# Patient Record
Sex: Female | Born: 1950
Health system: Southern US, Community
[De-identification: ages and names within clinical notes are randomized; demographics above are authoritative.]

## PROBLEM LIST (undated history)

## (undated) DIAGNOSIS — F419 Anxiety disorder, unspecified: Secondary | ICD-10-CM

## (undated) DIAGNOSIS — K219 Gastro-esophageal reflux disease without esophagitis: Secondary | ICD-10-CM

## (undated) DIAGNOSIS — J449 Chronic obstructive pulmonary disease, unspecified: Secondary | ICD-10-CM

## (undated) DIAGNOSIS — F32A Depression, unspecified: Secondary | ICD-10-CM

## (undated) DIAGNOSIS — M1990 Unspecified osteoarthritis, unspecified site: Secondary | ICD-10-CM

## (undated) DIAGNOSIS — I10 Essential (primary) hypertension: Secondary | ICD-10-CM

## (undated) DIAGNOSIS — F329 Major depressive disorder, single episode, unspecified: Secondary | ICD-10-CM

## (undated) DIAGNOSIS — K118 Other diseases of salivary glands: Secondary | ICD-10-CM

## (undated) HISTORY — DX: Chronic obstructive pulmonary disease, unspecified: J44.9

## (undated) HISTORY — DX: Essential (primary) hypertension: I10

## (undated) HISTORY — PX: ABDOMINAL HYSTERECTOMY: SHX81

## (undated) HISTORY — PX: CERVICAL SPINE SURGERY: SHX589

## (undated) HISTORY — PX: HERNIA REPAIR: SHX51

## (undated) HISTORY — PX: BREAST SURGERY: SHX581

---
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
## 2003-04-16 ENCOUNTER — Ambulatory Visit (HOSPITAL_COMMUNITY): Admission: RE | Admit: 2003-04-16 | Discharge: 2003-04-17 | Payer: Self-pay | Admitting: Orthopaedic Surgery

## 2016-12-03 ENCOUNTER — Institutional Professional Consult (permissible substitution): Payer: Self-pay | Admitting: Emergency Medicine

## 2016-12-28 ENCOUNTER — Institutional Professional Consult (permissible substitution): Payer: Self-pay | Admitting: Pulmonary Disease

## 2017-01-02 ENCOUNTER — Encounter: Payer: Self-pay | Admitting: Emergency Medicine

## 2017-01-03 ENCOUNTER — Other Ambulatory Visit (INDEPENDENT_AMBULATORY_CARE_PROVIDER_SITE_OTHER): Payer: Medicare HMO

## 2017-01-03 ENCOUNTER — Encounter: Payer: Self-pay | Admitting: *Deleted

## 2017-01-03 ENCOUNTER — Ambulatory Visit (INDEPENDENT_AMBULATORY_CARE_PROVIDER_SITE_OTHER): Payer: Medicare HMO | Admitting: Emergency Medicine

## 2017-01-03 VITALS — BP 138/86 | HR 85 | Ht 65.0 in | Wt 176.0 lb

## 2017-01-03 DIAGNOSIS — R05 Cough: Secondary | ICD-10-CM

## 2017-01-03 DIAGNOSIS — R053 Chronic cough: Secondary | ICD-10-CM

## 2017-01-03 DIAGNOSIS — B909 Sequelae of respiratory and unspecified tuberculosis: Secondary | ICD-10-CM

## 2017-01-03 DIAGNOSIS — R7611 Nonspecific reaction to tuberculin skin test without active tuberculosis: Secondary | ICD-10-CM | POA: Diagnosis not present

## 2017-01-03 DIAGNOSIS — J449 Chronic obstructive pulmonary disease, unspecified: Secondary | ICD-10-CM | POA: Insufficient documentation

## 2017-01-03 DIAGNOSIS — Z227 Latent tuberculosis: Secondary | ICD-10-CM | POA: Insufficient documentation

## 2017-01-03 LAB — BASIC METABOLIC PANEL
BUN: 19 mg/dL (ref 6–23)
CHLORIDE: 100 meq/L (ref 96–112)
CO2: 31 mEq/L (ref 19–32)
Calcium: 9.4 mg/dL (ref 8.4–10.5)
Creatinine, Ser: 1.11 mg/dL (ref 0.40–1.20)
GFR: 63.11 mL/min (ref >60.00)
Glucose, Bld: 109 mg/dL — ABNORMAL HIGH (ref 70–99)
POTASSIUM: 4 meq/L (ref 3.5–5.1)
Sodium: 138 mEq/L (ref 135–145)

## 2017-01-03 MED ORDER — FLUTICASONE PROPIONATE 50 MCG/ACT NA SUSP: 2.0000 | Freq: Every day | NASAL | 5 refills | Status: DC

## 2017-01-03 MED ORDER — LORATADINE 10 MG PO TABS: 10.0000 mg | ORAL_TABLET | Freq: Every day | ORAL | 5 refills | Status: DC

## 2017-01-03 MED ORDER — PANTOPRAZOLE SODIUM 40 MG PO TBEC: 40.0000 mg | DELAYED_RELEASE_TABLET | Freq: Every day | ORAL | 5 refills | Status: DC

## 2017-01-03 NOTE — Assessment & Plan Note (Signed)
Treated with single drug therapy many years ago. No evidence currently x-ray to support recurrence or active disease. She does have chronic cough and has had some chills, sweats, flulike symptoms. I believe it would be reasonable to repeat a CT scan of the chest to compare with 2012. Insure that there are no new involving nodules or infiltrates.

## 2017-01-03 NOTE — Assessment & Plan Note (Signed)
Likely multifactorial. She does have a history of suspected obstructive lung disease, asthma plus COPD. Also with poorly controlled reflux, poorly controlled allergic rhinitis. Her ACE- Inhibitor was recently changed to losartan. Finally given her history of latent TB we are compelled to ensure no evidence for active disease. Add pantoprazole, stop omeprazole, add loratadine and fluticasone nasal spray.

## 2017-01-03 NOTE — Progress Notes (Signed)
Subjective:    Patient ID: Cynthia Brewer, female    DOB: [birth year], 66 y.o.   MRN: [id number]  HPI 66 year old former smoker (35 pack years), with a history of hypertension, GERD, depression, treated for latent tuberculosis approximately 10 - 15 years ago ([place]). She carries a diagnosis of asthma /  COPD that was made 3-4 years ago. She had PFT at [facility] but unable to complete due to cough. She's been evaluated in September at [facility] for cough. At that time her lisinopril was changed to losartan, she was treated with prednisone and azithromycin. Has been treated with albuterol prn, was started Symbicort about 1 week ago. She has longstanding cough. She has allergy sx - congestion and drainage. Still has breakthrough reflux even on omeprazole. She coughs day and night, sometimes has to get up and has emesis in the night. CXR done recently  - no clear infiltrates. CT chest was reviewed by me from 01/16/2011, shows some very mild bibasilar atelectatic change, evidence for some emphysema, no nodules or discrete infiltrates. She states that she may occasionally have sweats. No fevers. Sometimes some flulike symptoms.   Review of Systems  Constitutional: Negative for fever and unexpected weight change.  HENT: Negative for congestion, dental problem, ear pain, nosebleeds, postnasal drip, rhinorrhea, sinus pressure, sneezing, sore throat and trouble swallowing.   Eyes: Negative for redness and itching.  Respiratory: Positive for shortness of breath. Negative for cough, chest tightness and wheezing.   Cardiovascular: Negative for palpitations and leg swelling.  Gastrointestinal: Negative for nausea and vomiting.  Genitourinary: Negative for dysuria.  Musculoskeletal: Negative for joint swelling.  Skin: Negative for rash.  Neurological: Positive for headaches.  Hematological: Does not bruise/bleed easily.  Psychiatric/Behavioral: Positive for dysphoric  mood. The patient is not nervous/anxious.     Past Medical History:  Diagnosis Date  . COPD (chronic obstructive pulmonary disease) ([place])   . Hypertension      No family history on file.   Social History   Social History  . Marital status: Married    Spouse name: N/A  . Number of children: N/A  . Years of education: N/A   Occupational History  . Not on file.   Social History Main Topics  . Smoking status: Former Smoker    Packs/day: 1.00    Years: 15.00    Types: Cigarettes    Quit date: 03/19/2006  . Smokeless tobacco: Never Used  . Alcohol use No  . Drug use: No  . Sexual activity: Not on file   Other Topics Concern  . Not on file   Social History Narrative  . 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   Allergies  Allergen Reactions  . Penicillins Hives     No outpatient prescriptions prior to visit.   No facility-administered medications prior to visit.         Objective:   Physical Exam Vitals:   01/03/17 1015  BP: 138/86  Pulse: 85  SpO2: 98%  Weight: 176 lb (79.8 kg)  Height: 5\' 5"  (1.651 m)   Gen: Pleasant, well-nourished, in no distress,  normal affect  ENT: No lesions,  mouth clear,  oropharynx clear, no postnasal drip  Neck: No JVD, no TMG, no carotid bruits  Lungs: No use of accessory muscles, clear without rales or rhonchi  Cardiovascular: RRR, heart sounds normal, no murmur or gallops, no  peripheral edema  Musculoskeletal: No deformities, no cyanosis or clubbing  Neuro: alert, non focal  Skin: Warm, no lesions or rashes      Assessment & Plan:  Latent tuberculosis by skin test Treated with single drug therapy many years ago. No evidence currently x-ray to support recurrence or active disease. She does have chronic cough and has had some chills, sweats, flulike symptoms. I believe it would be reasonable to repeat a CT scan of the chest to compare with 2012.  Insure that there are no new involving nodules or infiltrates.  Chronic cough Likely multifactorial. She does have a history of suspected obstructive lung disease, asthma plus COPD. Also with poorly controlled reflux, poorly controlled allergic rhinitis. Her ACE- Inhibitor was recently changed to losartan. Finally given her history of latent TB we are compelled to ensure no evidence for active disease. Add pantoprazole, stop omeprazole, add loratadine and fluticasone nasal spray.  COPD (chronic obstructive pulmonary disease) ([place]) Will continue Symbicort for now. Check all pulmonary function testing. Hopefully she will be able to perform once we get her cough under better control.  Baltazar Apo, MD, PhD 01/03/2017, 10:56 AM [facility] [phone] or if no answer [phone]

## 2017-01-03 NOTE — Patient Instructions (Signed)
We will repeat a CT scan of the chest at [facility] to review next time Please stop omeprazole for now Please start pantoprazole 40 mg. Take this medication twice a day for 10 days, then decrease to once a day until our next visit. Take this medication one hour before eating. Start loratadine 10 mg daily until next visit Start fluticasone nasal spray, 2 sprays each nostril once a day until next visit. Please do not take this medication right before going to bed Continue Symbicort 2 puffs twice a day. Remember to rinse and gargle after using this medicine We will perform full pulmonary function testing at your next visit Follow with Dr Lamonte Sakai in 1 month or next available

## 2017-01-03 NOTE — Assessment & Plan Note (Signed)
Will continue Symbicort for now. Check all pulmonary function testing. Hopefully she will be able to perform once we get her cough under better control.

## 2017-01-17 ENCOUNTER — Ambulatory Visit (HOSPITAL_COMMUNITY)
Admission: RE | Admit: 2017-01-17 | Discharge: 2017-01-17 | Disposition: A | Discharge status: Home / Self Care | Payer: Medicare HMO | Source: Ambulatory Visit | Attending: Emergency Medicine | Admitting: Emergency Medicine

## 2017-01-17 DIAGNOSIS — J984 Other disorders of lung: Secondary | ICD-10-CM | POA: Insufficient documentation

## 2017-01-17 DIAGNOSIS — B909 Sequelae of respiratory and unspecified tuberculosis: Secondary | ICD-10-CM

## 2017-01-17 MED ORDER — IOPAMIDOL (ISOVUE-300) INJECTION 61%
75.0000 mL | Freq: Once | INTRAVENOUS | Status: AC | PRN
  Administered 2017-01-17: 75 mL via INTRAVENOUS

## 2017-02-05 ENCOUNTER — Telehealth: Payer: Self-pay | Admitting: Emergency Medicine

## 2017-02-05 NOTE — Telephone Encounter (Signed)
IMPRESSION: 1. Bilateral areas of linear scarring identified. No suspicious pulmonary nodule or masses.   Electronically Signed   By: Kerby Moors M.D.   On: 01/17/2017 12:30  The above results usually m ean - no cancer, no copd, no ILDm , no infection, no nodule, . REcommend Cynthia Brewer d/w DR Lamonte Sakai at followup  Dr. Brand Males, M.D., [facility] Pulmonary and Critical Care Medicine Staff Physician [facility] Pager: [phone], If no answer or between  15:00h - 7:00h: call [phone]  02/05/2017 1:28 PM

## 2017-02-05 NOTE — Telephone Encounter (Signed)
Spoke with pt. She is aware of results. Nothing further was needed.  

## 2017-02-05 NOTE — Telephone Encounter (Signed)
Spoke with pt. She is requesting the results from her CT done on 01/17/2017. Explained to her that [place] was out of the office until 02/12/2017. Pt would like for one of the other MDs to review the results.  MR - please advise. Thanks.

## 2017-02-21 ENCOUNTER — Ambulatory Visit: Payer: Medicare HMO | Admitting: Emergency Medicine

## 2017-03-28 ENCOUNTER — Ambulatory Visit: Payer: Medicare HMO | Admitting: Emergency Medicine

## 2017-05-09 DIAGNOSIS — Z86010 Personal history of colonic polyps: Secondary | ICD-10-CM | POA: Insufficient documentation

## 2017-07-03 ENCOUNTER — Other Ambulatory Visit: Payer: Self-pay | Admitting: Emergency Medicine

## 2017-07-08 ENCOUNTER — Telehealth: Payer: Self-pay | Admitting: Pulmonary Disease

## 2017-07-08 NOTE — Telephone Encounter (Signed)
RB Patient.  PA request received from [facility] in [place]: [place] PA request has been sent to plan, and a determination is expected within (undetermined) days.   Routing to [place] for follow-up.

## 2017-07-09 NOTE — Telephone Encounter (Signed)
PA was approved. Pharmacy notified and attempted to call patient.  There was no option to leave VM.

## 2017-08-21 DIAGNOSIS — K625 Hemorrhage of anus and rectum: Secondary | ICD-10-CM | POA: Insufficient documentation

## 2017-09-20 ENCOUNTER — Other Ambulatory Visit: Payer: Self-pay | Admitting: Emergency Medicine

## 2018-01-24 ENCOUNTER — Other Ambulatory Visit: Payer: Self-pay | Admitting: Emergency Medicine

## 2018-02-14 ENCOUNTER — Other Ambulatory Visit (HOSPITAL_COMMUNITY): Payer: Self-pay | Admitting: Family Medicine

## 2018-02-14 DIAGNOSIS — M79605 Pain in left leg: Principal | ICD-10-CM

## 2018-02-14 DIAGNOSIS — M79604 Pain in right leg: Secondary | ICD-10-CM

## 2018-02-18 ENCOUNTER — Ambulatory Visit (HOSPITAL_COMMUNITY): Payer: Medicare HMO

## 2018-02-26 ENCOUNTER — Ambulatory Visit (HOSPITAL_COMMUNITY)
Admission: RE | Admit: 2018-02-26 | Discharge: 2018-02-26 | Disposition: A | Discharge status: Home / Self Care | Payer: Medicare HMO | Source: Ambulatory Visit | Attending: Family Medicine | Admitting: Family Medicine

## 2018-02-26 DIAGNOSIS — M79605 Pain in left leg: Secondary | ICD-10-CM | POA: Insufficient documentation

## 2018-02-26 DIAGNOSIS — M79604 Pain in right leg: Secondary | ICD-10-CM | POA: Diagnosis not present

## 2018-03-25 ENCOUNTER — Other Ambulatory Visit: Payer: Self-pay | Admitting: Internal Medicine

## 2018-03-25 NOTE — Telephone Encounter (Signed)
RB patient.

## 2018-04-10 DIAGNOSIS — L040 Acute lymphadenitis of face, head and neck: Secondary | ICD-10-CM | POA: Diagnosis not present

## 2018-04-14 DIAGNOSIS — R221 Localized swelling, mass and lump, neck: Secondary | ICD-10-CM | POA: Diagnosis not present

## 2018-04-14 DIAGNOSIS — L040 Acute lymphadenitis of face, head and neck: Secondary | ICD-10-CM | POA: Diagnosis not present

## 2018-04-15 DIAGNOSIS — K118 Other diseases of salivary glands: Secondary | ICD-10-CM | POA: Diagnosis not present

## 2018-04-28 DIAGNOSIS — Z1231 Encounter for screening mammogram for malignant neoplasm of breast: Secondary | ICD-10-CM | POA: Diagnosis not present

## 2018-05-05 ENCOUNTER — Ambulatory Visit (INDEPENDENT_AMBULATORY_CARE_PROVIDER_SITE_OTHER): Payer: Medicare Other | Admitting: Otolaryngology

## 2018-05-05 DIAGNOSIS — D442 Neoplasm of uncertain behavior of parathyroid gland: Secondary | ICD-10-CM

## 2018-06-12 ENCOUNTER — Other Ambulatory Visit: Payer: Self-pay | Admitting: Otolaryngology

## 2018-07-30 ENCOUNTER — Other Ambulatory Visit: Payer: Self-pay

## 2018-07-30 DIAGNOSIS — R05 Cough: Secondary | ICD-10-CM | POA: Diagnosis not present

## 2018-07-30 DIAGNOSIS — R300 Dysuria: Secondary | ICD-10-CM | POA: Diagnosis not present

## 2018-07-30 NOTE — Patient Outreach (Signed)
[facility]) Care Management  07/30/2018  Cynthia Brewer 08/11/1950 [id number]   Medication Adherence call to Cynthia Brewer patient did not answer patient is due on Losartan/Hctz 50/12.5 mg under [place].   [place] Management Direct Dial [phone]  Fax [phone] [email or web address]

## 2018-08-18 ENCOUNTER — Other Ambulatory Visit: Payer: Self-pay

## 2018-08-18 NOTE — Patient Outreach (Signed)
[facility]) Care Management  08/18/2018  Cynthia Brewer March 03, 1951 [id number]   Medication Adherence call to Cynthia Brewer patient did not answer patient is past due on Pravsatatin 10 mg and Losartan/Hctz 50/12.5 mg under [place].  [place] Management Direct Dial [phone]  Fax [phone] [email or web address]

## 2018-09-16 DIAGNOSIS — R300 Dysuria: Secondary | ICD-10-CM | POA: Diagnosis not present

## 2018-09-24 DIAGNOSIS — L249 Irritant contact dermatitis, unspecified cause: Secondary | ICD-10-CM | POA: Diagnosis not present

## 2018-09-24 DIAGNOSIS — M25511 Pain in right shoulder: Secondary | ICD-10-CM | POA: Diagnosis not present

## 2018-10-17 ENCOUNTER — Other Ambulatory Visit: Payer: Self-pay

## 2018-10-17 ENCOUNTER — Encounter (HOSPITAL_BASED_OUTPATIENT_CLINIC_OR_DEPARTMENT_OTHER): Payer: Self-pay | Admitting: *Deleted

## 2018-10-23 ENCOUNTER — Encounter (HOSPITAL_BASED_OUTPATIENT_CLINIC_OR_DEPARTMENT_OTHER)
Admission: RE | Admit: 2018-10-23 | Discharge: 2018-10-23 | Disposition: A | Discharge status: Home / Self Care | Payer: Medicare Other | Source: Ambulatory Visit | Attending: Otolaryngology | Admitting: Otolaryngology

## 2018-10-23 ENCOUNTER — Other Ambulatory Visit: Payer: Self-pay

## 2018-10-23 ENCOUNTER — Other Ambulatory Visit (HOSPITAL_COMMUNITY)
Admission: RE | Admit: 2018-10-23 | Discharge: 2018-10-23 | Disposition: A | Discharge status: Home / Self Care | Payer: Medicare Other | Source: Ambulatory Visit | Attending: Otolaryngology | Admitting: Otolaryngology

## 2018-10-23 DIAGNOSIS — D110 Benign neoplasm of parotid gland: Secondary | ICD-10-CM | POA: Insufficient documentation

## 2018-10-23 DIAGNOSIS — Z20828 Contact with and (suspected) exposure to other viral communicable diseases: Secondary | ICD-10-CM | POA: Diagnosis not present

## 2018-10-23 DIAGNOSIS — Z01818 Encounter for other preprocedural examination: Secondary | ICD-10-CM | POA: Diagnosis not present

## 2018-10-23 LAB — BASIC METABOLIC PANEL
Anion gap: 6 (ref 5–15)
BUN: 13 mg/dL (ref 8–23)
CO2: 28 mmol/L (ref 22–32)
Calcium: 9 mg/dL (ref 8.9–10.3)
Chloride: 104 mmol/L (ref 98–111)
Creatinine, Ser: 1.15 mg/dL — ABNORMAL HIGH (ref 0.44–1.00)
GFR calc Af Amer: 57 mL/min — ABNORMAL LOW (ref >60)
GFR calc non Af Amer: 49 mL/min — ABNORMAL LOW (ref >60)
Glucose, Bld: 91 mg/dL (ref 70–99)
Potassium: 5.1 mmol/L (ref 3.5–5.1)
Sodium: 138 mmol/L (ref 135–145)

## 2018-10-23 LAB — SARS CORONAVIRUS 2 (TAT 6-24 HRS): SARS Coronavirus 2: NEGATIVE

## 2018-10-27 ENCOUNTER — Ambulatory Visit (HOSPITAL_BASED_OUTPATIENT_CLINIC_OR_DEPARTMENT_OTHER): Payer: Medicare Other | Admitting: Anesthesiology

## 2018-10-27 ENCOUNTER — Other Ambulatory Visit: Payer: Self-pay

## 2018-10-27 ENCOUNTER — Encounter (HOSPITAL_BASED_OUTPATIENT_CLINIC_OR_DEPARTMENT_OTHER)
Admission: RE | Disposition: A | Discharge status: Home / Self Care | Payer: Self-pay | Source: Home / Self Care | Attending: Otolaryngology

## 2018-10-27 ENCOUNTER — Ambulatory Visit (HOSPITAL_BASED_OUTPATIENT_CLINIC_OR_DEPARTMENT_OTHER)
Admission: RE | Admit: 2018-10-27 | Discharge: 2018-10-28 | Disposition: A | Discharge status: Home / Self Care | Payer: Medicare Other | Attending: Otolaryngology | Admitting: Otolaryngology

## 2018-10-27 ENCOUNTER — Encounter (HOSPITAL_BASED_OUTPATIENT_CLINIC_OR_DEPARTMENT_OTHER): Payer: Self-pay | Admitting: Anesthesiology

## 2018-10-27 DIAGNOSIS — J449 Chronic obstructive pulmonary disease, unspecified: Secondary | ICD-10-CM | POA: Diagnosis not present

## 2018-10-27 DIAGNOSIS — I10 Essential (primary) hypertension: Secondary | ICD-10-CM | POA: Insufficient documentation

## 2018-10-27 DIAGNOSIS — K219 Gastro-esophageal reflux disease without esophagitis: Secondary | ICD-10-CM | POA: Insufficient documentation

## 2018-10-27 DIAGNOSIS — D110 Benign neoplasm of parotid gland: Secondary | ICD-10-CM | POA: Diagnosis not present

## 2018-10-27 DIAGNOSIS — D490 Neoplasm of unspecified behavior of digestive system: Secondary | ICD-10-CM | POA: Diagnosis not present

## 2018-10-27 DIAGNOSIS — F419 Anxiety disorder, unspecified: Secondary | ICD-10-CM | POA: Diagnosis not present

## 2018-10-27 DIAGNOSIS — R221 Localized swelling, mass and lump, neck: Secondary | ICD-10-CM | POA: Diagnosis present

## 2018-10-27 DIAGNOSIS — Z791 Long term (current) use of non-steroidal anti-inflammatories (NSAID): Secondary | ICD-10-CM | POA: Diagnosis not present

## 2018-10-27 DIAGNOSIS — F329 Major depressive disorder, single episode, unspecified: Secondary | ICD-10-CM | POA: Insufficient documentation

## 2018-10-27 DIAGNOSIS — Z9049 Acquired absence of other specified parts of digestive tract: Secondary | ICD-10-CM

## 2018-10-27 DIAGNOSIS — Z7951 Long term (current) use of inhaled steroids: Secondary | ICD-10-CM | POA: Diagnosis not present

## 2018-10-27 DIAGNOSIS — Z87891 Personal history of nicotine dependence: Secondary | ICD-10-CM | POA: Insufficient documentation

## 2018-10-27 DIAGNOSIS — M1990 Unspecified osteoarthritis, unspecified site: Secondary | ICD-10-CM | POA: Diagnosis not present

## 2018-10-27 DIAGNOSIS — Z79899 Other long term (current) drug therapy: Secondary | ICD-10-CM | POA: Insufficient documentation

## 2018-10-27 DIAGNOSIS — D37030 Neoplasm of uncertain behavior of the parotid salivary glands: Secondary | ICD-10-CM | POA: Diagnosis not present

## 2018-10-27 HISTORY — DX: Depression, unspecified: F32.A

## 2018-10-27 HISTORY — DX: Unspecified osteoarthritis, unspecified site: M19.90

## 2018-10-27 HISTORY — DX: Gastro-esophageal reflux disease without esophagitis: K21.9

## 2018-10-27 HISTORY — DX: Other diseases of salivary glands: K11.8

## 2018-10-27 HISTORY — PX: PAROTIDECTOMY: SHX2163

## 2018-10-27 HISTORY — DX: Anxiety disorder, unspecified: F41.9

## 2018-10-27 SURGERY — EXCISION, PAROTID GLAND
Anesthesia: General | Site: Neck | Laterality: Right

## 2018-10-27 MED ORDER — OXYCODONE-ACETAMINOPHEN 5-325 MG PO TABS
1.0000 | ORAL_TABLET | ORAL | Status: DC | PRN
  Administered 2018-10-27 – 2018-10-28 (×2): 1 via ORAL
  Filled 2018-10-27 (×2): qty 1

## 2018-10-27 MED ORDER — EPHEDRINE SULFATE-NACL 50-0.9 MG/10ML-% IV SOSY
PREFILLED_SYRINGE | INTRAVENOUS | Status: DC | PRN
  Administered 2018-10-27: 15 mg via INTRAVENOUS
  Administered 2018-10-27: 10 mg via INTRAVENOUS
  Administered 2018-10-27: 15 mg via INTRAVENOUS

## 2018-10-27 MED ORDER — AMLODIPINE BESYLATE 10 MG PO TABS
10.0000 mg | ORAL_TABLET | Freq: Every day | ORAL | Status: DC
  Administered 2018-10-27: 10 mg via ORAL

## 2018-10-27 MED ORDER — SUCCINYLCHOLINE CHLORIDE 20 MG/ML IJ SOLN
INTRAMUSCULAR | Status: DC | PRN
  Administered 2018-10-27: 100 mg via INTRAVENOUS

## 2018-10-27 MED ORDER — FENTANYL CITRATE (PF) 100 MCG/2ML IJ SOLN
INTRAMUSCULAR | Status: AC
  Filled 2018-10-27: qty 2

## 2018-10-27 MED ORDER — ONDANSETRON HCL 4 MG/2ML IJ SOLN
INTRAMUSCULAR | Status: AC
  Filled 2018-10-27: qty 2

## 2018-10-27 MED ORDER — OXYCODONE-ACETAMINOPHEN 5-325 MG PO TABS: 1.0000 | ORAL_TABLET | ORAL | 0 refills | Status: AC | PRN

## 2018-10-27 MED ORDER — GLYCOPYRROLATE 0.2 MG/ML IJ SOLN
INTRAMUSCULAR | Status: DC | PRN
  Administered 2018-10-27: 0.2 mg via INTRAVENOUS

## 2018-10-27 MED ORDER — CLINDAMYCIN PHOSPHATE 900 MG/50ML IV SOLN
INTRAVENOUS | Status: DC | PRN
  Administered 2018-10-27: 900 mg via INTRAVENOUS

## 2018-10-27 MED ORDER — ARIPIPRAZOLE 10 MG PO TBDP: 10.0000 mg | ORAL_TABLET | Freq: Every day | ORAL | Status: DC

## 2018-10-27 MED ORDER — ONDANSETRON HCL 4 MG/2ML IJ SOLN
INTRAMUSCULAR | Status: DC | PRN
  Administered 2018-10-27: 4 mg via INTRAVENOUS

## 2018-10-27 MED ORDER — ONDANSETRON HCL 4 MG/2ML IJ SOLN: 4.0000 mg | Freq: Four times a day (QID) | INTRAMUSCULAR | Status: DC | PRN

## 2018-10-27 MED ORDER — OXYCODONE HCL 5 MG PO TABS: 5.0000 mg | ORAL_TABLET | Freq: Once | ORAL | Status: DC | PRN

## 2018-10-27 MED ORDER — SUCCINYLCHOLINE CHLORIDE 200 MG/10ML IV SOSY
PREFILLED_SYRINGE | INTRAVENOUS | Status: AC
  Filled 2018-10-27: qty 10

## 2018-10-27 MED ORDER — LIDOCAINE 2% (20 MG/ML) 5 ML SYRINGE
INTRAMUSCULAR | Status: DC | PRN
  Administered 2018-10-27: 50 mg via INTRAVENOUS

## 2018-10-27 MED ORDER — CLINDAMYCIN PHOSPHATE 900 MG/50ML IV SOLN
INTRAVENOUS | Status: AC
  Filled 2018-10-27: qty 50

## 2018-10-27 MED ORDER — LIDOCAINE-EPINEPHRINE 1 %-1:100000 IJ SOLN
INTRAMUSCULAR | Status: DC | PRN
  Administered 2018-10-27: 4 mL

## 2018-10-27 MED ORDER — LIDOCAINE 2% (20 MG/ML) 5 ML SYRINGE
INTRAMUSCULAR | Status: AC
  Filled 2018-10-27: qty 5

## 2018-10-27 MED ORDER — MIDAZOLAM HCL 2 MG/2ML IJ SOLN
INTRAMUSCULAR | Status: AC
  Filled 2018-10-27: qty 2

## 2018-10-27 MED ORDER — PRAVASTATIN SODIUM 10 MG PO TABS: 10.0000 mg | ORAL_TABLET | Freq: Every day | ORAL | Status: DC

## 2018-10-27 MED ORDER — ESMOLOL HCL 100 MG/10ML IV SOLN
INTRAVENOUS | Status: DC | PRN
  Administered 2018-10-27: 20 mg via INTRAVENOUS

## 2018-10-27 MED ORDER — FENTANYL CITRATE (PF) 100 MCG/2ML IJ SOLN: 25.0000 ug | INTRAMUSCULAR | Status: DC | PRN

## 2018-10-27 MED ORDER — ROCURONIUM BROMIDE 10 MG/ML (PF) SYRINGE
PREFILLED_SYRINGE | INTRAVENOUS | Status: AC
  Filled 2018-10-27: qty 10

## 2018-10-27 MED ORDER — LACTATED RINGERS IV SOLN
INTRAVENOUS | Status: DC
  Administered 2018-10-27 (×2): via INTRAVENOUS

## 2018-10-27 MED ORDER — LOSARTAN POTASSIUM-HCTZ 50-12.5 MG PO TABS
1.0000 | ORAL_TABLET | Freq: Every day | ORAL | Status: DC
  Administered 2018-10-27: 1 via ORAL

## 2018-10-27 MED ORDER — OXYCODONE HCL 5 MG/5ML PO SOLN: 5.0000 mg | Freq: Once | ORAL | Status: DC | PRN

## 2018-10-27 MED ORDER — PANTOPRAZOLE SODIUM 40 MG PO TBEC
40.0000 mg | DELAYED_RELEASE_TABLET | Freq: Every day | ORAL | Status: DC
  Administered 2018-10-27: 40 mg via ORAL
  Filled 2018-10-27: qty 1

## 2018-10-27 MED ORDER — FLUOXETINE HCL 40 MG PO CAPS
40.0000 mg | ORAL_CAPSULE | Freq: Every day | ORAL | Status: DC
  Administered 2018-10-27: 40 mg via ORAL

## 2018-10-27 MED ORDER — PROPOFOL 10 MG/ML IV BOLUS
INTRAVENOUS | Status: DC | PRN
  Administered 2018-10-27: 150 mg via INTRAVENOUS

## 2018-10-27 MED ORDER — DEXAMETHASONE SODIUM PHOSPHATE 4 MG/ML IJ SOLN
INTRAMUSCULAR | Status: DC | PRN
  Administered 2018-10-27: 4 mg via INTRAVENOUS

## 2018-10-27 MED ORDER — ESMOLOL HCL 100 MG/10ML IV SOLN
INTRAVENOUS | Status: AC
  Filled 2018-10-27: qty 10

## 2018-10-27 MED ORDER — CLINDAMYCIN HCL 300 MG PO CAPS: 300.0000 mg | ORAL_CAPSULE | Freq: Three times a day (TID) | ORAL | 0 refills | Status: AC

## 2018-10-27 MED ORDER — MOMETASONE FURO-FORMOTEROL FUM 200-5 MCG/ACT IN AERO
2.0000 | INHALATION_SPRAY | Freq: Two times a day (BID) | RESPIRATORY_TRACT | Status: DC
  Administered 2018-10-27 (×2): 2 via RESPIRATORY_TRACT
  Filled 2018-10-27: qty 8.8

## 2018-10-27 MED ORDER — FENTANYL CITRATE (PF) 100 MCG/2ML IJ SOLN
INTRAMUSCULAR | Status: DC | PRN
  Administered 2018-10-27 (×2): 50 ug via INTRAVENOUS
  Administered 2018-10-27: 25 ug via INTRAVENOUS
  Administered 2018-10-27: 50 ug via INTRAVENOUS
  Administered 2018-10-27: 25 ug via INTRAVENOUS

## 2018-10-27 MED ORDER — DEXAMETHASONE SODIUM PHOSPHATE 10 MG/ML IJ SOLN
INTRAMUSCULAR | Status: AC
  Filled 2018-10-27: qty 1

## 2018-10-27 MED ORDER — KCL IN DEXTROSE-NACL 20-5-0.45 MEQ/L-%-% IV SOLN
INTRAVENOUS | Status: DC
  Administered 2018-10-27: 12:00:00 via INTRAVENOUS
  Filled 2018-10-27: qty 1000

## 2018-10-27 MED ORDER — MORPHINE SULFATE (PF) 4 MG/ML IV SOLN: 2.0000 mg | INTRAVENOUS | Status: DC | PRN

## 2018-10-27 SURGICAL SUPPLY — 60 items
APPLICATOR DR MATTHEWS STRL (MISCELLANEOUS) ×3 IMPLANT
ATTRACTOMAT 16X20 MAGNETIC DRP (DRAPES) IMPLANT
BLADE SURG 15 STRL LF DISP TIS (BLADE) ×1 IMPLANT
BLADE SURG 15 STRL SS (BLADE) ×2
CANISTER SUCT 1200ML W/VALVE (MISCELLANEOUS) ×3 IMPLANT
CORD BIPOLAR FORCEPS 12FT (ELECTRODE) ×3 IMPLANT
COTTONBALL LRG STERILE PKG (GAUZE/BANDAGES/DRESSINGS) ×3 IMPLANT
COVER BACK TABLE REUSABLE LG (DRAPES) ×3 IMPLANT
COVER MAYO STAND REUSABLE (DRAPES) ×3 IMPLANT
COVER WAND RF STERILE (DRAPES) IMPLANT
DECANTER SPIKE VIAL GLASS SM (MISCELLANEOUS) IMPLANT
DERMABOND ADVANCED (GAUZE/BANDAGES/DRESSINGS) ×2
DERMABOND ADVANCED .7 DNX12 (GAUZE/BANDAGES/DRESSINGS) ×1 IMPLANT
DRAIN CHANNEL 10F 3/8 F FF (DRAIN) ×3 IMPLANT
DRAPE SPLIT 6X30 W/TAPE (DRAPES) ×3 IMPLANT
DRAPE SURG 17X23 STRL (DRAPES) IMPLANT
ELECT COATED BLADE 2.86 ST (ELECTRODE) ×3 IMPLANT
ELECT PAIRED SUBDERMAL (MISCELLANEOUS) ×3
ELECT REM PT RETURN 9FT ADLT (ELECTROSURGICAL) ×3
ELECTRODE PAIRED SUBDERMAL (MISCELLANEOUS) ×1 IMPLANT
ELECTRODE REM PT RTRN 9FT ADLT (ELECTROSURGICAL) ×1 IMPLANT
EVACUATOR SILICONE 100CC (DRAIN) ×3 IMPLANT
FORCEPS BIPOLAR SPETZLER 8 1.0 (NEUROSURGERY SUPPLIES) ×3 IMPLANT
GAUZE 4X4 16PLY RFD (DISPOSABLE) ×6 IMPLANT
GLOVE BIO SURGEON STRL SZ 6.5 (GLOVE) IMPLANT
GLOVE BIO SURGEON STRL SZ7.5 (GLOVE) ×3 IMPLANT
GLOVE BIO SURGEONS STRL SZ 6.5 (GLOVE)
GOWN STRL REUS W/ TWL LRG LVL3 (GOWN DISPOSABLE) ×3 IMPLANT
GOWN STRL REUS W/TWL LRG LVL3 (GOWN DISPOSABLE) ×6
LOCATOR NERVE 3 VOLT (DISPOSABLE) ×3 IMPLANT
NEEDLE HYPO 25X1 1.5 SAFETY (NEEDLE) ×3 IMPLANT
NS IRRIG 1000ML POUR BTL (IV SOLUTION) ×3 IMPLANT
PACK BASIN DAY SURGERY FS (CUSTOM PROCEDURE TRAY) ×3 IMPLANT
PAD ALCOHOL SWAB (MISCELLANEOUS) ×6 IMPLANT
PENCIL BUTTON HOLSTER BLD 10FT (ELECTRODE) ×3 IMPLANT
PIN SAFETY STERILE (MISCELLANEOUS) ×3 IMPLANT
PROBE NERVBE PRASS .33 (MISCELLANEOUS) ×3 IMPLANT
SHEARS HARMONIC 9CM CVD (BLADE) ×3 IMPLANT
SLEEVE SCD COMPRESS KNEE MED (MISCELLANEOUS) ×3 IMPLANT
SOL PREP POV-IOD 16OZ 10% (MISCELLANEOUS) ×3 IMPLANT
SPONGE INTESTINAL PEANUT (DISPOSABLE) ×3 IMPLANT
STAPLER VISISTAT 35W (STAPLE) IMPLANT
SUCTION FRAZIER HANDLE 10FR (MISCELLANEOUS) ×2
SUCTION TUBE FRAZIER 10FR DISP (MISCELLANEOUS) ×1 IMPLANT
SUT ETHILON 3 0 PS 1 (SUTURE) IMPLANT
SUT PROLENE 5 0 PS 2 (SUTURE) ×3 IMPLANT
SUT SILK 2 0 PERMA HAND 18 BK (SUTURE) ×9 IMPLANT
SUT SILK 2 0 TIES 17X18 (SUTURE)
SUT SILK 2-0 18XBRD TIE BLK (SUTURE) IMPLANT
SUT SILK 3 0 TIES 17X18 (SUTURE) ×2
SUT SILK 3-0 18XBRD TIE BLK (SUTURE) ×1 IMPLANT
SUT SILK 4 0 TIES 17X18 (SUTURE) IMPLANT
SUT VIC AB 3-0 FS2 27 (SUTURE) IMPLANT
SUT VICRYL 4-0 PS2 18IN ABS (SUTURE) ×3 IMPLANT
SYR BULB 3OZ (MISCELLANEOUS) ×3 IMPLANT
SYR CONTROL 10ML LL (SYRINGE) ×3 IMPLANT
TOWEL GREEN STERILE FF (TOWEL DISPOSABLE) ×3 IMPLANT
TRAY DSU PREP LF (CUSTOM PROCEDURE TRAY) ×3 IMPLANT
TUBE CONNECTING 20'X1/4 (TUBING) ×1
TUBE CONNECTING 20X1/4 (TUBING) ×2 IMPLANT

## 2018-10-27 NOTE — Transfer of Care (Signed)
Immediate Anesthesia Transfer of Care Note  Patient: Cynthia Brewer  Procedure(s) Performed: RIGHT PAROTIDECTOMY (Right Neck)  Patient Location: PACU  Anesthesia Type:General  Level of Consciousness: drowsy  Airway & Oxygen Therapy: Patient Spontanous Breathing and Patient connected to face mask oxygen  Post-op Assessment: Report given to RN and Post -op Vital signs reviewed and stable  Post vital signs: Reviewed and stable  Last Vitals:  Vitals Value Taken Time  BP 151/76 10/27/18 1117  Temp    Pulse 95 10/27/18 1119  Resp 15 10/27/18 1119  SpO2 98 % 10/27/18 1119  Vitals shown include unvalidated device data.  Last Pain:  Vitals:   10/27/18 0806  PainSc: 0-No pain         Complications: No apparent anesthesia complications

## 2018-10-27 NOTE — Op Note (Signed)
DATE OF PROCEDURE:  10/27/2018                              OPERATIVE REPORT  SURGEON:  Leta Baptist, MD  PREOPERATIVE DIAGNOSES: 1. Right parotid mass.  POSTOPERATIVE DIAGNOSES: 1. Right parotid mass.  PROCEDURE PERFORMED: Right lateral parotidectomy with facial nerve dissection and preservation.  ANESTHESIA:  General endotracheal tube anesthesia.  COMPLICATIONS:  None.  ESTIMATED BLOOD LOSS: 50 mL  INDICATION FOR PROCEDURE:  Cynthia Brewer is a 68 y.o. female with a history of an enlarging parotid mass. The patient first noticed the mass over 2 months ago. She complained that it has increased in size. The patient underwent a neck CT which showed a 1.3 cm right parotid tail mass.  Based on the above findings, the decision was made for the patient to undergo the above-stated procedure.  The risks, benefits, alternatives, and details of the procedure were discussed with the patient.  Questions were invited and answered.  Informed consent was obtained.  DESCRIPTION:  The patient was taken to the operating room and placed supine on the operating table.  General endotracheal tube anesthesia was administered by the anesthesiologist.  The patient was positioned and prepped and draped in a standard fashion for right parotidectomy surgery.    Facial nerve monitoring electrodes were placed.  The facial nerve monitoring system was functional throughout the case.  1% lidocaine with 1-100,000 epinephrine was infiltrated at the planned site of incision.  A standard right-sided facelift incision was made without difficulty.  The SMAS flap was elevated in the standard fashion.  A 1.5 cm firm mass was noted within the tail of the right parotid gland.  Careful dissection was performed along the right preauricular crease.  The dissection was carried down to the level of the main trunk of the facial nerve.  Dissection was then carried out to free the main trunk and the branches of the facial nerve.  All branches  of the facial nerve were identified and preserved.  They were noted to be functional throughout the case.  The parotid tumor was noted to be abutting the inferior branches of the facial nerve.  The branches were carefully dissected free from the tumor.  The entire tumor was removed and sent to the pathology department for permanent histologic identification.  The surgical site was copiously irrigated.  A #10 JP drain was placed.  The incision was closed in layers with 4-0 Vicryl and Dermabond.  The care of the patient was turned over to the anesthesiologist.  The patient was awakened from anesthesia without difficulty.  The patient was extubated and transferred to the recovery room in good condition.  OPERATIVE FINDINGS:  Right parotid tumor.  SPECIMEN:  Right parotid tumor.  FOLLOWUP CARE:  The patient will be observed overnight.  The patient will follow up in my office in approximately 1 week.  Umair Rosiles W Jam[place]us [place] 10/27/2018 11:16 AM

## 2018-10-27 NOTE — Discharge Instructions (Signed)
Post Anesthesia Home Care Instructions  Activity: Get plenty of rest for the remainder of the day. A responsible individual must stay with you for 24 hours following the procedure.  For the next 24 hours, DO NOT: -Drive a car -Paediatric nurse -Drink alcoholic beverages -Take any medication unless instructed by your physician -Make any legal decisions or sign important papers.  Meals: Start with liquid foods such as gelatin or soup. Progress to regular foods as tolerated. Avoid greasy, spicy, heavy foods. If nausea and/or vomiting occur, drink only clear liquids until the nausea and/or vomiting subsides. Call your physician if vomiting continues.  Special Instructions/Symptoms: Your throat may feel dry or sore from the anesthesia or the breathing tube placed in your throat during surgery. If this causes discomfort, gargle with warm salt water. The discomfort should disappear within 24 hours.  If you had a scopolamine patch placed behind your ear for the management of post- operative nausea and/or vomiting:  1. The medication in the patch is effective for 72 hours, after which it should be removed.  Wrap patch in a tissue and discard in the trash. Wash hands thoroughly with soap and water. 2. You may remove the patch earlier than 72 hours if you experience unpleasant side effects which may include dry mouth, dizziness or visual disturbances. 3. Avoid touching the patch. Wash your hands with soap and water after contact with the patch.    -----------------   Parotidectomy, Care After This sheet gives you information about how to care for yourself after your procedure. Your health care provider may also give you more specific instructions. If you have problems or questions, contact your health care provider. What can I expect after the procedure? After the procedure, it is common to have:  Pain and mild swelling at the incision site.  Numbness along the incision.  Numbness in part  or all of your ear.  Mild jaw discomfort on the surgical side when you are eating or chewing. This may last up to 2-4 weeks. Follow these instructions at home: Medicines   Take over-the-counter and prescription medicines only as told by your health care provider.  If you were prescribed an antibiotic medicine, take it as told by your health care provider. Do not stop taking the antibiotic even if you start to feel better.  Ask your health care provider if the medicine prescribed to you: ? Requires you to avoid driving or using heavy machinery. ? Can cause constipation. You may need to take actions to prevent or treat constipation, such as:  Drink enough fluid to keep your urine pale yellow.  Take over-the-counter or prescription medicines.  Eat foods that are high in fiber, such as beans, whole grains, and fresh fruits and vegetables.  Limit foods that are high in fat and processed sugars, such as fried or sweet foods. Incision care   Follow instructions from your health care provider about how to take care of your incision. Make sure you: ? Leave stitches (sutures), skin glue, or adhesive strips in place. These skin closures may need to be in place for 2 weeks or longer. If adhesive strip edges start to loosen and curl up, you may trim the loose edges. Do not remove adhesive strips completely unless your health care provider tells you to do that.  Check your incision area every day for signs of infection. Check for: ? More redness, swelling, or pain. ? Fluid or blood. ? Warmth. ? Pus or a bad smell.  Follow  your health care provider's instructions about cleaning and maintaining the drain that was placed near your incision. Eating and drinking  Follow instructions from your health care provider about eating or drinking restrictions.  If your mouth or jaw is sore, try eating soft foods until you feel better. Activity  Return to your normal activities as told by your health  care provider. Ask your health care provider what activities are safe for you.  Rest as told by your health care provider.  Avoid sitting for a long time without moving. Get up to take short walks every 1-2 hours. This is important to improve blood flow and breathing. Ask for help if you feel weak or unsteady.  Do not lift anything that is heavier than 10 lb (4.5 kg), or the limit that you are told, until your health care provider says that it is safe. General instructions  Keep your head raised (elevated) when you lie down during the first few weeks after surgery. This will help prevent increased swelling.  Keep all follow-up visits as told by your health care provider. This is important. Contact a health care provider if:  You have pain that does not get better with medicine.  You have more redness, swelling, or pain around your incision.  You have fluid or blood coming from your incision.  Your incision feels warm to the touch.  You have pus or a bad smell coming from your incision.  You vomit or feel nauseous.  You have a fever. Get help right away if:  You have more pain, swelling, or redness that suddenly gets worse at the incision site.  You have increasing numbness or weakness in your face.  You have severe pain. Summary  After the procedure, it is common to have mild jaw discomfort on the surgical side when you are eating or chewing. This may last up to 2-4 weeks.  Follow instructions from your health care provider about how to take care of your incision.  If your mouth or jaw is sore, try eating soft foods until you feel better.  Return to your normal activities as told by your health care provider. Ask your health care provider what activities are safe for you. This information is not intended to replace advice given to you by your health care provider. Make sure you discuss any questions you have with your health care provider. Document Released: 04/07/2010  Document Revised: 12/23/2017 Document Reviewed: 12/25/2017 Elsevier Patient Education  2020 [employer].

## 2018-10-27 NOTE — H&P (Signed)
Cc: Parotid mass  HPI: The patient is a 68 y/o female who presents today for evaluation of a right parotid mass. The patient is seen in consultation requested by [place]. The patient first noticed the mass over 2 months ago. She feels it has increased in size. The patient underwent a neck CT which showed a 1.3 cm right parotid tail mass. The patient is eating and drinking without difficulty. No throat pain is noted. She quit smoking over 20 years ago. The patient had ACDF in the past but no ENT surgery.  The patient's review of systems (constitutional, eyes, ENT, cardiovascular, respiratory, GI, musculoskeletal, skin, neurologic, psychiatric, endocrine, hematologic, allergic) is noted in the ROS questionnaire.  It is reviewed with the patient.   Family health history: No HTN, DM, CAD, hearing loss or bleeding disorder.  Major events: Cervical spine surgery.  Ongoing medical problems: COPD, hypertension.  Social history: The patient is married. She denies the use of tobacco, alcohol or illegal drugs.   Exam: General: Communicates without difficulty, well nourished, no acute distress. Head: Normocephalic, no evidence injury, no tenderness, facial buttresses intact without stepoff. Eyes: PERRL, EOMI. No scleral icterus, conjunctivae clear. Neuro: CN II exam reveals vision grossly intact.  No nystagmus at any point of gaze. Ears: Auricles well formed without lesions.  Ear canals are intact without mass or lesion.  No erythema or edema is appreciated.  The TMs are intact without fluid. Nose: External evaluation reveals normal support and skin without lesions.  Dorsum is intact.  Anterior rhinoscopy reveals healthy pink mucosa over anterior aspect of inferior turbinates and intact septum.  No purulence noted. Oral:  Oral cavity and oropharynx are intact, symmetric, without erythema or edema.  Mucosa is moist without lesions. Neck: Full range of motion without pain.  There is no significant  lymphadenopathy.  Thyroid bed within normal limits to palpation.  Submandibular glands equal bilaterally without mass.  A firm right parotid mass is noted. Trachea is midline. Neuro:  CN 2-12 grossly intact. Gait normal. Vestibular: No nystagmus at any point of gaze. The cerebellar examination is unremarkable.   Assessment A 1.3 cm firm right parotid mass.   Plan  1. The physical exam and CT findings are reviewed with the patient.  2. Treatment options include conservative observation, FNA, or parotidectomy. The risks, benefits, alternatives, and details of the treatment options are extensively reviewed with the patient. Questions are invited and answered. 3. The patient is interested in proceeding with the parotidectomy procedure.  We will schedule the procedure in accordance with the family schedule.

## 2018-10-27 NOTE — Anesthesia Procedure Notes (Signed)
Procedure Name: Intubation Date/Time: 10/27/2018 9:07 AM Performed by: Lieutenant Diego, CRNA Pre-anesthesia Checklist: Patient identified, Emergency Drugs available, Suction available and Patient being monitored Patient Re-evaluated:Patient Re-evaluated prior to induction Oxygen Delivery Method: Circle system utilized Preoxygenation: Pre-oxygenation with 100% oxygen Induction Type: IV induction Ventilation: Mask ventilation without difficulty Laryngoscope Size: Miller and 2 Grade View: Grade I Tube type: Oral Tube size: 7.0 mm Number of attempts: 1 Airway Equipment and Method: Stylet and Oral airway Placement Confirmation: ETT inserted through vocal cords under direct vision,  positive ETCO2 and breath sounds checked- equal and bilateral Secured at: 22 cm Tube secured with: Tape Dental Injury: Teeth and Oropharynx as per pre-operative assessment

## 2018-10-27 NOTE — Anesthesia Preprocedure Evaluation (Signed)
Anesthesia Evaluation  Patient identified by MRN, date of birth, ID band Patient awake    Reviewed: Allergy & Precautions, H&P , NPO status , Patient's Chart, lab work & pertinent test results  Airway Mallampati: II   Neck ROM: full    Dental   Pulmonary COPD, former smoker,    breath sounds clear to auscultation       Cardiovascular hypertension,  Rhythm:regular Rate:Normal     Neuro/Psych PSYCHIATRIC DISORDERS Anxiety Depression    GI/Hepatic GERD  ,  Endo/Other    Renal/GU      Musculoskeletal  (+) Arthritis ,   Abdominal   Peds  Hematology   Anesthesia Other Findings   Reproductive/Obstetrics                             Anesthesia Physical Anesthesia Plan  ASA: II  Anesthesia Plan: General   Post-op Pain Management:    Induction: Intravenous  PONV Risk Score and Plan: 3 and Ondansetron, Dexamethasone, Midazolam and Treatment may vary due to age or medical condition  Airway Management Planned: Oral ETT  Additional Equipment:   Intra-op Plan:   Post-operative Plan: Extubation in OR  Informed Consent: I have reviewed the patients History and Physical, chart, labs and discussed the procedure including the risks, benefits and alternatives for the proposed anesthesia with the patient or authorized representative who has indicated his/her understanding and acceptance.       Plan Discussed with: CRNA, Anesthesiologist and Surgeon  Anesthesia Plan Comments:         Anesthesia Quick Evaluation

## 2018-10-28 ENCOUNTER — Encounter (HOSPITAL_BASED_OUTPATIENT_CLINIC_OR_DEPARTMENT_OTHER): Payer: Self-pay | Admitting: Otolaryngology

## 2018-10-28 DIAGNOSIS — D110 Benign neoplasm of parotid gland: Secondary | ICD-10-CM | POA: Diagnosis not present

## 2018-10-28 DIAGNOSIS — K219 Gastro-esophageal reflux disease without esophagitis: Secondary | ICD-10-CM | POA: Diagnosis not present

## 2018-10-28 DIAGNOSIS — Z87891 Personal history of nicotine dependence: Secondary | ICD-10-CM | POA: Diagnosis not present

## 2018-10-28 DIAGNOSIS — Z79899 Other long term (current) drug therapy: Secondary | ICD-10-CM | POA: Diagnosis not present

## 2018-10-28 DIAGNOSIS — J449 Chronic obstructive pulmonary disease, unspecified: Secondary | ICD-10-CM | POA: Diagnosis not present

## 2018-10-28 DIAGNOSIS — I10 Essential (primary) hypertension: Secondary | ICD-10-CM | POA: Diagnosis not present

## 2018-10-28 DIAGNOSIS — Z791 Long term (current) use of non-steroidal anti-inflammatories (NSAID): Secondary | ICD-10-CM | POA: Diagnosis not present

## 2018-10-28 DIAGNOSIS — M1990 Unspecified osteoarthritis, unspecified site: Secondary | ICD-10-CM | POA: Diagnosis not present

## 2018-10-28 DIAGNOSIS — Z7951 Long term (current) use of inhaled steroids: Secondary | ICD-10-CM | POA: Diagnosis not present

## 2018-10-28 NOTE — Discharge Summary (Signed)
Physician Discharge Summary  Patient ID: Cynthia Brewer MRN: [id number] DOB/AGE: 1951-01-06 68 y.o.  Admit date: 10/27/2018 Discharge date: 10/28/2018  Admission Diagnoses: Right parotid mass  Discharge Diagnoses: Right parotid mass Active Problems:   History of parotidectomy   Discharged Condition: good  Hospital Course: Pt had an uneventful overnight stay. Pt tolerated po well. No bleeding. No stridor.Facial nerve function intact bilaterally.  Consults: None  Significant Diagnostic Studies: None  Treatments: surgery: Right parotidectomy  Discharge Exam: Blood pressure (!) 159/76, pulse 84, temperature 97.6 F (36.4 C), resp. rate 18, height 5\' 5"  (1.651 m), weight 79.8 kg, SpO2 96 %. Incision/Wound:c/d/i CN 7 intact  Disposition: Discharge disposition: 01-Home or Self Care       Discharge Instructions    Activity as tolerated - No restrictions   Complete by: As directed    Activity as tolerated - No restrictions   Complete by: As directed      Allergies as of 10/28/2018      Reactions   Penicillins Hives      Medication List    STOP taking these medications   ibuprofen 200 MG tablet Commonly known as: ADVIL     TAKE these medications   amLODipine 10 MG tablet Commonly known as: NORVASC Take 10 mg by mouth daily.   aripiprazole 10 MG disintegrating tablet Commonly known as: ABILIFY Take 10 mg by mouth daily.   budesonide-formoterol 160-4.5 MCG/ACT inhaler Commonly known as: SYMBICORT Inhale 2 puffs into the lungs 2 (two) times daily.   clindamycin 300 MG capsule Commonly known as: Cleocin Take 1 capsule (300 mg total) by mouth 3 (three) times daily for 3 days.   FLUoxetine 40 MG capsule Commonly known as: PROZAC Take 40 mg by mouth daily.   losartan-hydrochlorothiazide 50-12.5 MG tablet Commonly known as: HYZAAR Take 1 tablet by mouth daily.   omeprazole 20 MG capsule Commonly known as: PRILOSEC Take 20 mg by mouth daily.    oxyCODONE-acetaminophen 5-325 MG tablet Commonly known as: Percocet Take 1 tablet by mouth every 4 (four) hours as needed for up to 5 days for severe pain.   pravastatin 10 MG tablet Commonly known as: PRAVACHOL Take 10 mg by mouth daily.      Follow-up Information    Leta Baptist, MD On 11/03/2018.   Specialty: Otolaryngology Why: at [employer] information: [address] [place] [address] [phone]           Signed: Burley Saver 10/28/2018, 7:36 AM

## 2018-10-29 NOTE — Anesthesia Postprocedure Evaluation (Signed)
Anesthesia Post Note  Patient: Cynthia Brewer  Procedure(s) Performed: RIGHT PAROTIDECTOMY (Right Neck)     Patient location during evaluation: PACU Anesthesia Type: General Level of consciousness: awake and alert Pain management: pain level controlled Vital Signs Assessment: post-procedure vital signs reviewed and stable Respiratory status: spontaneous breathing, nonlabored ventilation, respiratory function stable and patient connected to nasal cannula oxygen Cardiovascular status: blood pressure returned to baseline and stable Postop Assessment: no apparent nausea or vomiting Anesthetic complications: no    Last Vitals:  Vitals:   10/28/18 0600 10/28/18 0700  BP:    Pulse: 88 84  Resp: 18 18  Temp:    SpO2: 95% 96%    Last Pain:  Vitals:   10/28/18 0700  PainSc: [place]

## 2018-11-01 IMAGING — CT CT CHEST W/ CM
2 of 3 series · 15 of 36 positions shown, 18 images · IV contrast (iopamidol)
Comparison: None

CLINICAL DATA: Cough for 1 year.

EXAM:
CT CHEST WITH CONTRAST
TECHNIQUE: Multidetector CT imaging of the chest was performed during
intravenous contrast administration.
CONTRAST:  75mL OST4G4-LYY IOPAMIDOL (OST4G4-LYY) INJECTION 61%

[Series 2: axial st · axial · 0.50mm/px · z∈[+1225,+1475]mm · 12 of 147 slices shown, 15 images]
[im 11/147  mediastinal]
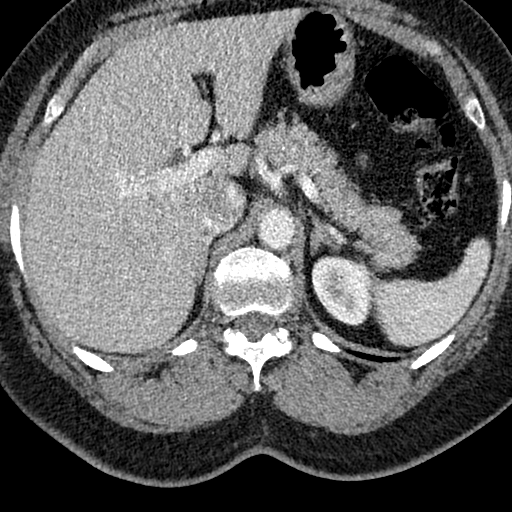
[im 11/147  lung]
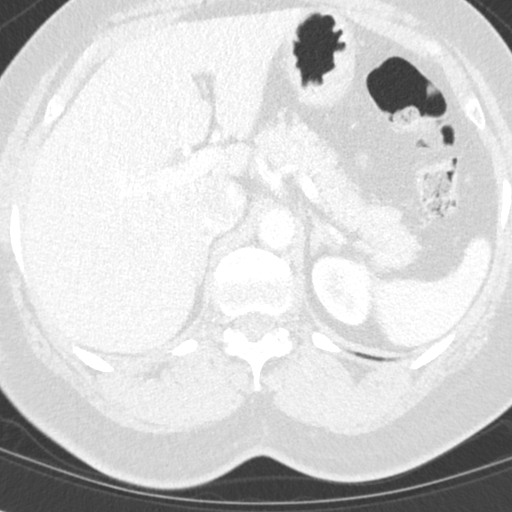
[im 22/147  lung]
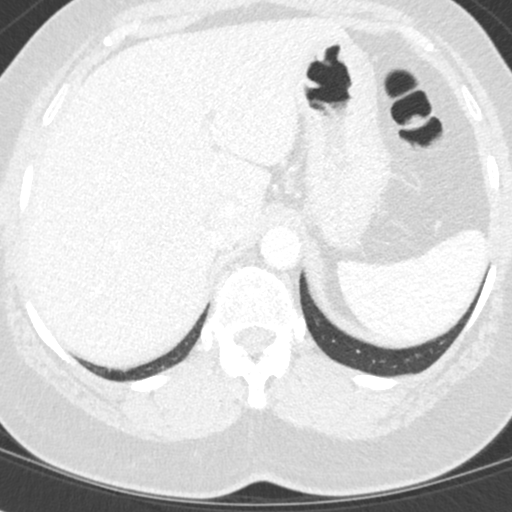
[im 33/147  lung]
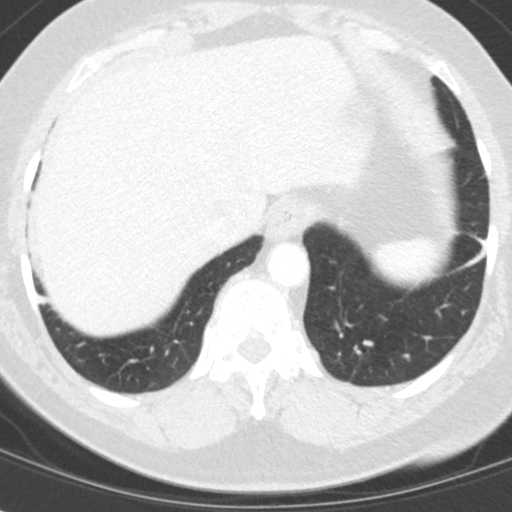
[im 44/147  lung]
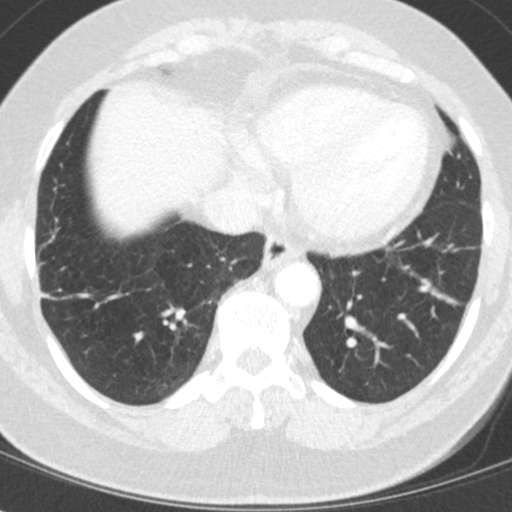
[im 55/147  mediastinal]
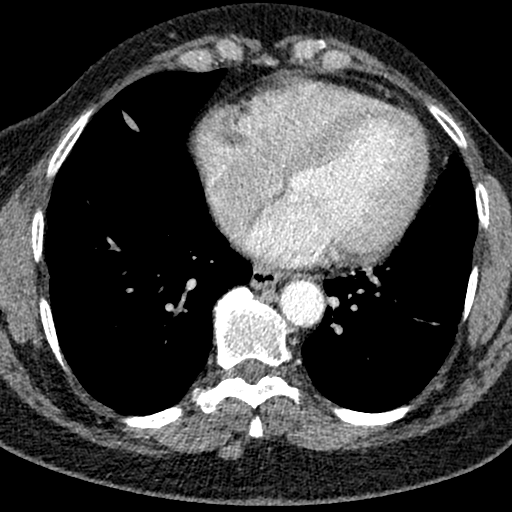
[im 55/147  lung]
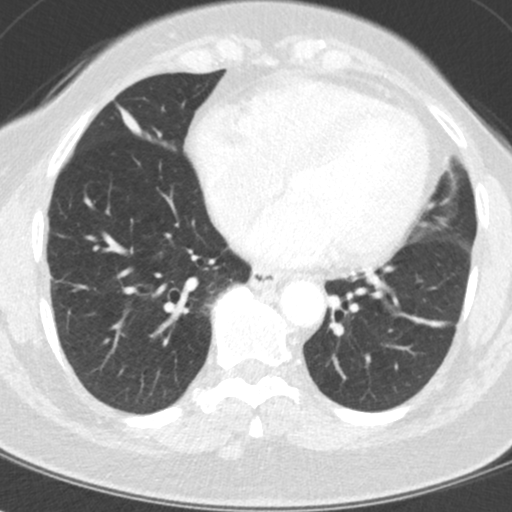
[im 65/147  lung]
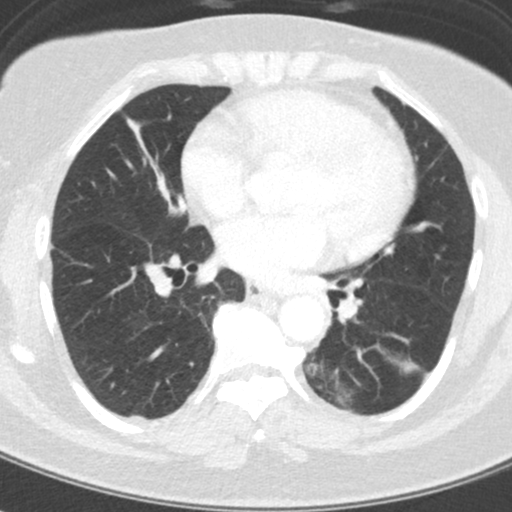
[im 82/147  lung]
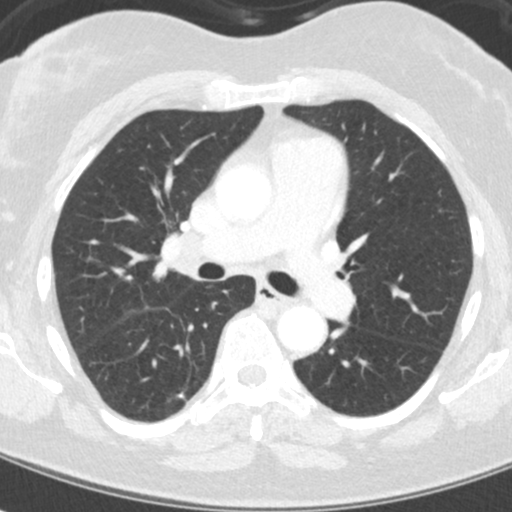
[im 92/147  lung]
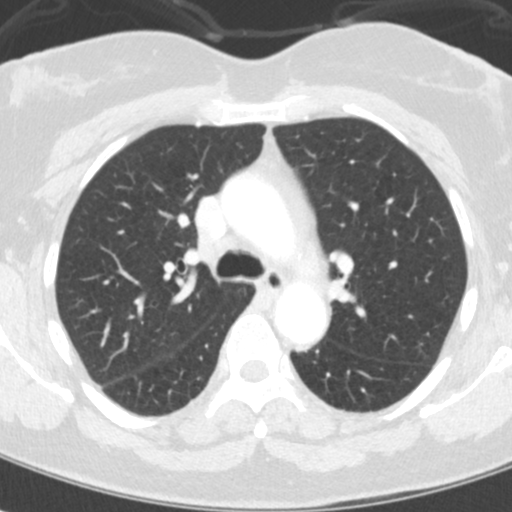
[im 103/147  mediastinal]
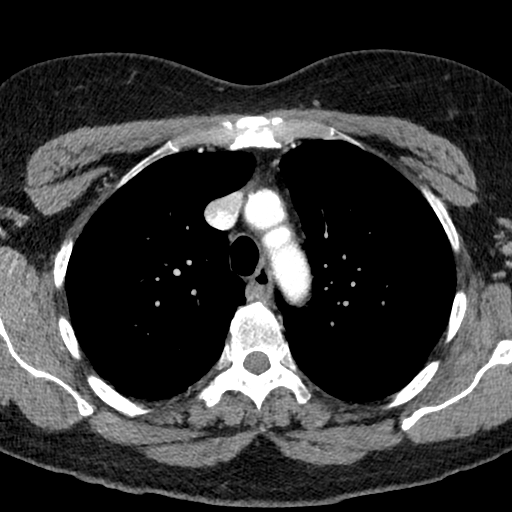
[im 103/147  lung]
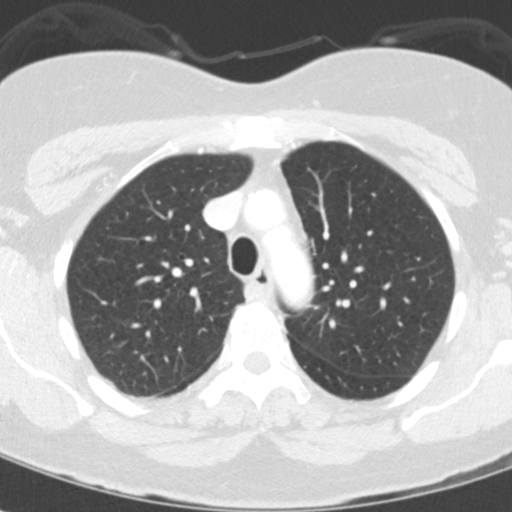
[im 114/147  lung]
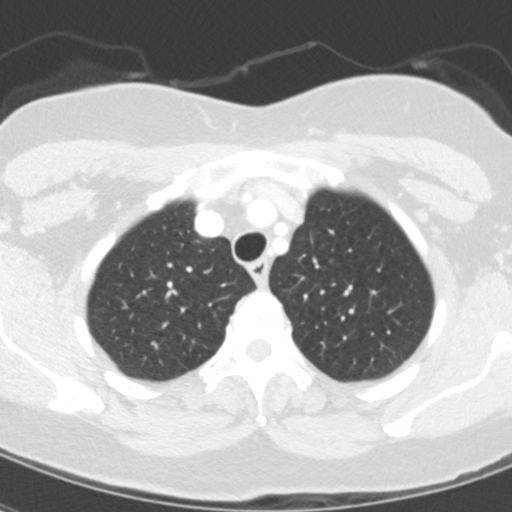
[im 125/147  lung]
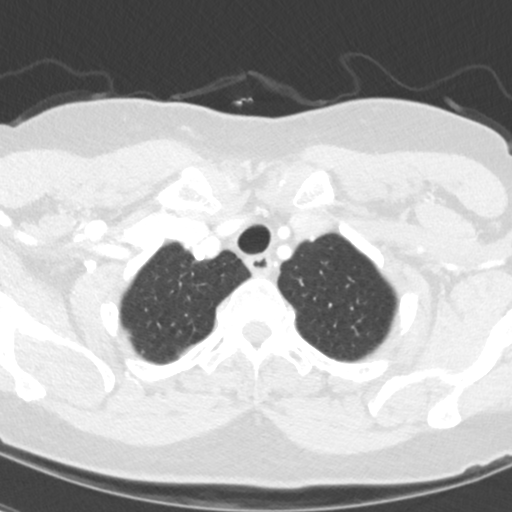
[im 136/147  lung]
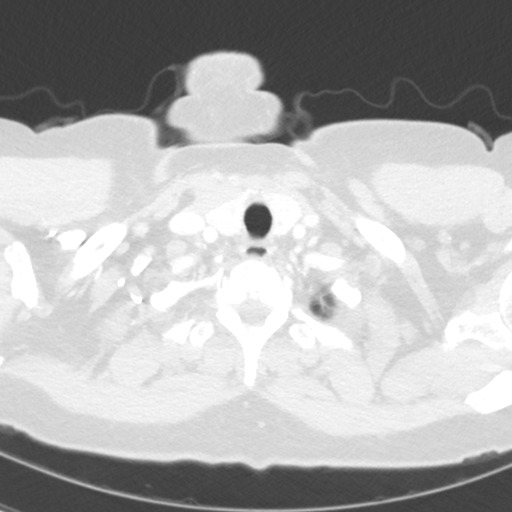

[Series 5: coronal · coronal · 0.56mm/px · 3 of 134 slices shown]
[im 27/134  lung]
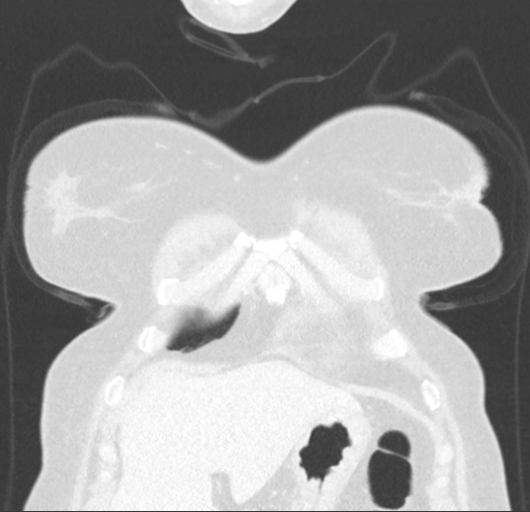
[im 54/134  lung]
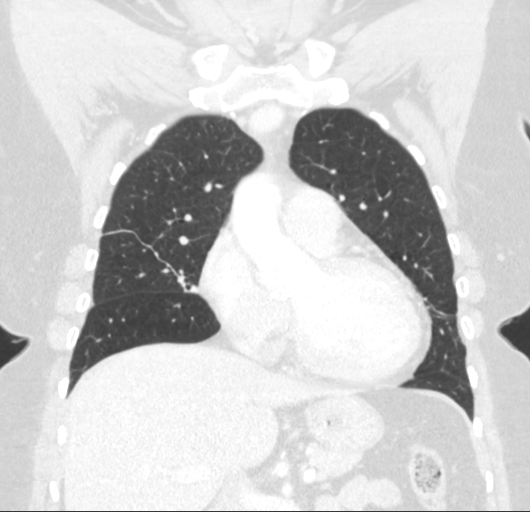
[im 80/134  lung]
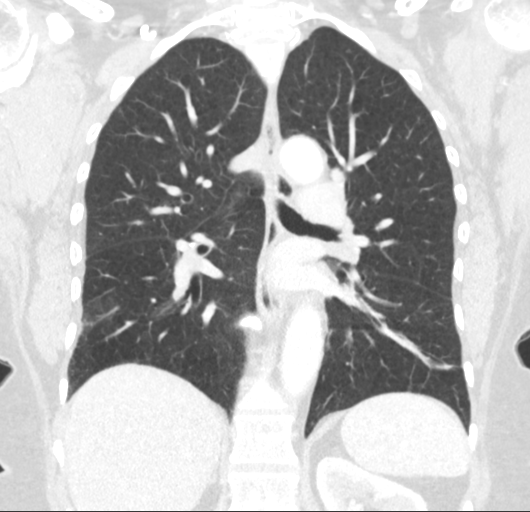

[15 of 36 positions shown; findings below may reference images not displayed]

FINDINGS: Cardiovascular: Mild cardiac enlargement.  No pericardial effusion.

Mediastinum/Nodes: The trachea appears patent and is midline. Normal
appearance of the esophagus. No enlarged mediastinal or hilar lymph
nodes identified.

Lungs/Pleura: No pleural effusion identified. Mild changes of
centrilobular emphysema. Scar noted within the right middle lobe,
left lower lobe and lingula. No suspicious pulmonary nodules.
Calcified granuloma identified within the superior segment of right
lower lobe.

Upper Abdomen: No acute abnormality.

Musculoskeletal: No aggressive lytic or sclerotic bone lesions.
IMPRESSION: 1. Bilateral areas of linear scarring identified. No suspicious
pulmonary nodule or masses.

## 2018-11-03 ENCOUNTER — Ambulatory Visit (INDEPENDENT_AMBULATORY_CARE_PROVIDER_SITE_OTHER): Payer: Medicare Other | Admitting: Otolaryngology

## 2018-11-04 DIAGNOSIS — B370 Candidal stomatitis: Secondary | ICD-10-CM | POA: Diagnosis not present

## 2018-11-17 ENCOUNTER — Ambulatory Visit (INDEPENDENT_AMBULATORY_CARE_PROVIDER_SITE_OTHER): Payer: Medicare Other | Admitting: Otolaryngology

## 2019-02-02 DIAGNOSIS — R591 Generalized enlarged lymph nodes: Secondary | ICD-10-CM | POA: Diagnosis not present

## 2019-03-03 DIAGNOSIS — N760 Acute vaginitis: Secondary | ICD-10-CM | POA: Diagnosis not present

## 2019-06-02 DIAGNOSIS — J440 Chronic obstructive pulmonary disease with acute lower respiratory infection: Secondary | ICD-10-CM | POA: Diagnosis not present

## 2019-06-02 DIAGNOSIS — E782 Mixed hyperlipidemia: Secondary | ICD-10-CM | POA: Diagnosis not present

## 2019-06-02 DIAGNOSIS — I10 Essential (primary) hypertension: Secondary | ICD-10-CM | POA: Diagnosis not present

## 2019-06-02 DIAGNOSIS — R350 Frequency of micturition: Secondary | ICD-10-CM | POA: Diagnosis not present

## 2019-06-11 DIAGNOSIS — H40023 Open angle with borderline findings, high risk, bilateral: Secondary | ICD-10-CM | POA: Diagnosis not present

## 2019-06-11 DIAGNOSIS — Z83511 Family history of glaucoma: Secondary | ICD-10-CM | POA: Diagnosis not present

## 2019-07-02 DIAGNOSIS — M7050 Other bursitis of knee, unspecified knee: Secondary | ICD-10-CM | POA: Diagnosis not present

## 2019-07-02 DIAGNOSIS — I10 Essential (primary) hypertension: Secondary | ICD-10-CM | POA: Diagnosis not present

## 2019-07-02 DIAGNOSIS — J449 Chronic obstructive pulmonary disease, unspecified: Secondary | ICD-10-CM | POA: Diagnosis not present

## 2019-07-02 DIAGNOSIS — R0602 Shortness of breath: Secondary | ICD-10-CM | POA: Diagnosis not present

## 2019-08-05 ENCOUNTER — Other Ambulatory Visit: Payer: Self-pay

## 2019-08-05 ENCOUNTER — Encounter: Payer: Self-pay | Admitting: Internal Medicine

## 2019-08-05 ENCOUNTER — Ambulatory Visit (INDEPENDENT_AMBULATORY_CARE_PROVIDER_SITE_OTHER): Payer: Medicare Other | Admitting: Internal Medicine

## 2019-08-05 VITALS — BP 138/70 | HR 87 | Temp 98.0°F | Ht 67.0 in | Wt 179.0 lb

## 2019-08-05 DIAGNOSIS — R0600 Dyspnea, unspecified: Secondary | ICD-10-CM | POA: Diagnosis not present

## 2019-08-05 DIAGNOSIS — G4733 Obstructive sleep apnea (adult) (pediatric): Secondary | ICD-10-CM | POA: Diagnosis not present

## 2019-08-05 DIAGNOSIS — R0609 Other forms of dyspnea: Secondary | ICD-10-CM

## 2019-08-05 MED ORDER — CETIRIZINE HCL 10 MG PO TABS: 10.0000 mg | ORAL_TABLET | Freq: Every day | ORAL | 11 refills | Status: DC

## 2019-08-05 MED ORDER — FAMOTIDINE 20 MG PO TABS: 20.0000 mg | ORAL_TABLET | Freq: Two times a day (BID) | ORAL | 11 refills | Status: DC

## 2019-08-05 MED ORDER — MONTELUKAST SODIUM 10 MG PO TABS: 10.0000 mg | ORAL_TABLET | Freq: Every day | ORAL | 11 refills | Status: DC

## 2019-08-05 NOTE — Progress Notes (Signed)
Synopsis: DOE, cough  Assessment & Plan:  Problem 1 DOE: CT and CXR are pretty benign.  She walked around the office today and needed to stop: sats stayed above 98%, HR 100-110 range, clear lungs on exam on exertion. No increase in cough noted.  Differential is atypical anginal presentation vs. Anemia vs. Deconditioning.  This has been going on with intermittent flares for past several years. Problem 2 COPD minimal emphysema on CT chest 2018.  On symbicort. Problem 3 Witnessed apnea malampatti 4 airway, says she had a sleep study at some point Problem 4 Chronic bronchitis- worse with lying down and exertion Problem 5 Smoking history- tobacco history too low and distant for benefit of LDCT screening  - Continue symbicort; trial of singulair/pepcid/zyrtec to try to get dry cough under better control - Check CBC to assure no anemia and to get eosinophil count - Check IgE - Cardiology referral to determine if and what type of stress test needed - PFTs - Home sleep study - Goal is to get her back to point where she can walk with her husband again in park - 6 weeks f/u to review available studies.  Patient also notes a globus sensation when eating solids ever since her ACDF and parotid tumor removal surgeries: may warrant fluoro eval to look for structural deficiencies or occult aspiration  MDM . I reviewed prior external note(s) from Dr. Nadara Brewer on 07/06/19 . I reviewed the result(s) of BMP in Aug 2020 showing mild renal insufficiency . I have ordered tests and meds above  Review of patient's 01/17/2017 chest ct images revealed mild atelectasis and apical emphysematous change. The patient's images have been independently reviewed by me.    End of visit medications:  Current Outpatient Medications:  .  amLODipine (NORVASC) 10 MG tablet, Take 10 mg by mouth daily., Disp: , Rfl:  .  aripiprazole (ABILIFY) 10 MG disintegrating tablet, Take 10 mg by mouth daily., Disp: , Rfl:  .   budesonide-formoterol (SYMBICORT) 160-4.5 MCG/ACT inhaler, Inhale 2 puffs into the lungs 2 (two) times daily., Disp: , Rfl:  .  FLUoxetine (PROZAC) 40 MG capsule, Take 40 mg by mouth daily., Disp: , Rfl:  .  losartan-hydrochlorothiazide (HYZAAR) 50-12.5 MG tablet, Take 1 tablet by mouth daily., Disp: , Rfl:  .  omeprazole (PRILOSEC) 20 MG capsule, Take 20 mg by mouth daily., Disp: , Rfl:  .  pravastatin (PRAVACHOL) 10 MG tablet, Take 10 mg by mouth daily., Disp: , Rfl:  .  cetirizine (ZYRTEC ALLERGY) 10 MG tablet, Take 1 tablet (10 mg total) by mouth daily., Disp: 30 tablet, Rfl: 11 .  famotidine (PEPCID) 20 MG tablet, Take 1 tablet (20 mg total) by mouth 2 (two) times daily., Disp: 60 tablet, Rfl: 11 .  montelukast (SINGULAIR) 10 MG tablet, Take 1 tablet (10 mg total) by mouth at bedtime., Disp: 30 tablet, Rfl: [place], MD [facility] 08/05/2019 4:43 PM    Subjective:   PATIENT ID: Cynthia Brewer: female DOB: 12/12/1950, MRN: [id number]  Chief Complaint  Patient presents with  . Consult    SOB at night     HPI Here for breathlessness Worse at night and with exertion 3-4 years ago was able to walk with husband in park, now gets dyspneic even going to mailbox Carries diagnosis of COPD without PFTs with symbicort being only home med Never hospitalized for breathing Denies allergic symptoms or GERD Not necessarily associated but also present  is a longstanding dry cough for multiple years, worse with talking and laying on back States she had sleep study and some sort of cardiac study at another hospital, no records that I can find for this  Ancillary information including prior medications, full medical/surgical/family/social histoies, and PFTs (when available) are listed below and have been reviewed.   ROS + symptoms in bold Fevers, chills, weight loss Nausea, vomiting, diarrhea Shortness of breath, wheezing, cough Chest pain, palpitations,  lower ext edema   Objective:   Vitals:   08/05/19 1505  BP: 138/70  Pulse: 87  Temp: 98 F (36.7 C)  TempSrc: Temporal  SpO2: 97%  Weight: 179 lb (81.2 kg)  Height: 5\' 7"  (1.702 m)   97% on RA BMI Readings from Last 3 Encounters:  08/05/19 28.04 kg/m  10/27/18 29.28 kg/m  01/03/17 29.29 kg/m   Wt Readings from Last 3 Encounters:  08/05/19 179 lb (81.2 kg)  10/27/18 175 lb 14.8 oz (79.8 kg)  01/03/17 176 lb (79.8 kg)    GEN: pleasant woman in no acute distress HEENT: trachea midline, mucus membranes moist, malampatti 4, dentures in place CV: Regular rate and rhythm, extremities are warm PULM: clear, no wheezing or accessory muscle use GI: Soft, +BS EXT: no edema or clubbing NEURO: Moves all 4 extremities   Ancillary Information    Past Medical History:  Diagnosis Date  . Anxiety   . Arthritis    hands and knees  . COPD (chronic obstructive pulmonary disease) ([place])   . Depression   . GERD (gastroesophageal reflux disease)   . Hypertension   . Mass of right parotid gland      History reviewed. No pertinent family history.   Past Surgical History:  Procedure Laterality Date  . ABDOMINAL HYSTERECTOMY    . BREAST SURGERY Bilateral    breast bx  . CERVICAL SPINE SURGERY    . HERNIA REPAIR     UHR  . PAROTIDECTOMY Right 10/27/2018   Procedure: RIGHT PAROTIDECTOMY;  Surgeon: Cynthia Baptist, MD;  Location: [facility];  Service: ENT;  Laterality: Right;    Social History   Socioeconomic History  . Marital status: Married    Spouse name: Not on file  . Number of children: Not on file  . Years of education: Not on file  . Highest education level: Not on file  Occupational History  . Not on file  Tobacco Use  . Smoking status: Former Smoker    Packs/day: 1.00    Years: 15.00    Pack years: 15.00    Types: Cigarettes    Quit date: 03/19/2006    Years since quitting: 13.3  . Smokeless tobacco: Never Used  Substance and Sexual Activity  .  Alcohol use: No  . Drug use: No  . Sexual activity: Not on file  Other Topics Concern  . Not on file  Social History Narrative  . Not on file   Social Determinants of Health   Financial Resource Strain:   . Difficulty of Paying Living Expenses:   Food Insecurity:   . Worried About Charity fundraiser in the Last Year:   . Arboriculturist in the Last Year:   Transportation Needs:   . Film/video editor (Medical):   Marland Kitchen Lack of Transportation (Non-Medical):   Physical Activity:   . Days of Exercise per Week:   . Minutes of Exercise per Session:   Stress:   . Feeling of Stress :  Social Connections:   . Frequency of Communication with Friends and Family:   . Frequency of Social Gatherings with Friends and Family:   . Attends Religious Services:   . Active Member of Clubs or Organizations:   . Attends Archivist Meetings:   Marland Kitchen Marital Status:   Intimate Partner Violence:   . Fear of Current or Ex-Partner:   . Emotionally Abused:   Marland Kitchen Physically Abused:   . Sexually Abused:      Allergies  Allergen Reactions  . Penicillins Hives     CBC No results found for: WBC, RBC, HGB, HCT, PLT, MCV, MCH, MCHC, RDW, LYMPHSABS, MONOABS, EOSABS, BASOSABS  Pulmonary Functions Testing Results: No flowsheet data found.  Outpatient Medications Prior to Visit  Medication Sig Dispense Refill  . amLODipine (NORVASC) 10 MG tablet Take 10 mg by mouth daily.    Marland Kitchen aripiprazole (ABILIFY) 10 MG disintegrating tablet Take 10 mg by mouth daily.    . budesonide-formoterol (SYMBICORT) 160-4.5 MCG/ACT inhaler Inhale 2 puffs into the lungs 2 (two) times daily.    Marland Kitchen FLUoxetine (PROZAC) 40 MG capsule Take 40 mg by mouth daily.    Marland Kitchen losartan-hydrochlorothiazide (HYZAAR) 50-12.5 MG tablet Take 1 tablet by mouth daily.    Marland Kitchen omeprazole (PRILOSEC) 20 MG capsule Take 20 mg by mouth daily.    . pravastatin (PRAVACHOL) 10 MG tablet Take 10 mg by mouth daily.     No facility-administered  medications prior to visit.

## 2019-08-05 NOTE — Patient Instructions (Addendum)
-   Try the following meds in addition to your symbicort for your cough: zyrtec, singulair, pepcid - Blood work - See a heart doctor to get appropriate stress test to make sure this shortness of breath is not from your heart - Pulmonary function tests at some point - Home sleep test - Come back in 6 weeks so we can see how you are feeling and review your available tests

## 2019-08-18 ENCOUNTER — Encounter: Payer: Self-pay | Admitting: General Practice

## 2019-08-24 DIAGNOSIS — D37030 Neoplasm of uncertain behavior of the parotid salivary glands: Secondary | ICD-10-CM | POA: Diagnosis not present

## 2019-08-28 ENCOUNTER — Ambulatory Visit: Payer: Medicare Other | Admitting: Cardiovascular Disease

## 2019-08-28 ENCOUNTER — Encounter: Payer: Self-pay | Admitting: Cardiovascular Disease

## 2019-08-28 ENCOUNTER — Other Ambulatory Visit: Payer: Self-pay

## 2019-08-28 DIAGNOSIS — E782 Mixed hyperlipidemia: Secondary | ICD-10-CM

## 2019-08-28 DIAGNOSIS — R0600 Dyspnea, unspecified: Secondary | ICD-10-CM

## 2019-08-28 DIAGNOSIS — I10 Essential (primary) hypertension: Secondary | ICD-10-CM | POA: Diagnosis not present

## 2019-08-28 DIAGNOSIS — E785 Hyperlipidemia, unspecified: Secondary | ICD-10-CM | POA: Insufficient documentation

## 2019-08-28 DIAGNOSIS — R0609 Other forms of dyspnea: Secondary | ICD-10-CM

## 2019-08-28 DIAGNOSIS — R0789 Other chest pain: Secondary | ICD-10-CM | POA: Diagnosis not present

## 2019-08-28 NOTE — Assessment & Plan Note (Signed)
Several month history of dyspnea exertion, remote history of tobacco abuse having quit 20 years ago.  Go to get a 2D echo to further evaluate

## 2019-08-28 NOTE — Assessment & Plan Note (Signed)
History of hyperlipidemia on statin therapy with lipid profile performed 06/02/2019 revealing total cholesterol 221, HDL of 64 and LDL of 116.  This is followed by her PCP

## 2019-08-28 NOTE — Assessment & Plan Note (Signed)
History of essential hypertension with blood pressure measured today at 140/80.  She is on amlodipine, losartan and hydrochlorothiazide.

## 2019-08-28 NOTE — Progress Notes (Signed)
08/28/2019 Cynthia Brewer   1950/08/04  [id number]  Primary Physician Practice, [facility] Primary Cardiologist: Lorretta Harp MD Cynthia Brewer, [place]  HPI:  Cynthia Brewer is a 69 y.o. mildly overweight married African-American female mother of 3 children, grandmother of 95 grandchildren referred by Dr. Tamala Brewer, her PCP for evaluation of chest pain and dyspnea.  She is a retired [employer] manager.  Risk factors include treated hypertension and hyperlipidemia.  She does have GERD.  There is no family history for heart disease.  She is never had a heart attack or stroke.  She smoked remotely and stopped 20 years ago.  She does complain of some atypical chest pain occurring twice a month that is left inframammary as well as a several month history of new onset dyspnea on exertion.   Current Meds  Medication Sig   amLODipine (NORVASC) 10 MG tablet Take 10 mg by mouth daily.   aripiprazole (ABILIFY) 10 MG disintegrating tablet Take 10 mg by mouth daily.   budesonide-formoterol (SYMBICORT) 160-4.5 MCG/ACT inhaler Inhale 2 puffs into the lungs 2 (two) times daily.   cetirizine (ZYRTEC ALLERGY) 10 MG tablet Take 1 tablet (10 mg total) by mouth daily.   famotidine (PEPCID) 20 MG tablet Take 1 tablet (20 mg total) by mouth 2 (two) times daily.   FLUoxetine (PROZAC) 40 MG capsule Take 40 mg by mouth daily.   IBUPROFEN & ACETAMINOPHEN PO Take 1 tablet by mouth as needed.   losartan-hydrochlorothiazide (HYZAAR) 50-12.5 MG tablet Take 1 tablet by mouth daily.   montelukast (SINGULAIR) 10 MG tablet Take 1 tablet (10 mg total) by mouth at bedtime.   omeprazole (PRILOSEC) 20 MG capsule Take 20 mg by mouth daily.   pravastatin (PRAVACHOL) 10 MG tablet Take 10 mg by mouth daily.     Allergies  Allergen Reactions   Penicillins Hives    Social History   Socioeconomic History   Marital status: Married    Spouse name: Not on file   Number of children: Not on file   Years of  education: Not on file   Highest education level: Not on file  Occupational History   Not on file  Tobacco Use   Smoking status: Former Smoker    Packs/day: 1.00    Years: 15.00    Pack years: 15.00    Types: Cigarettes    Quit date: 03/19/2006    Years since quitting: 13.4   Smokeless tobacco: Never Used  Substance and Sexual Activity   Alcohol use: No   Drug use: No   Sexual activity: Not on file  Other Topics Concern   Not on file  Social History Narrative   Not on file   Social Determinants of Health   Financial Resource Strain:    Difficulty of Paying Living Expenses:   Food Insecurity:    Worried About Charity fundraiser in the Last Year:    Arboriculturist in the Last Year:   Transportation Needs:    Film/video editor (Medical):    Lack of Transportation (Non-Medical):   Physical Activity:    Days of Exercise per Week:    Minutes of Exercise per Session:   Stress:    Feeling of Stress :   Social Connections:    Frequency of Communication with Friends and Family:    Frequency of Social Gatherings with Friends and Family:    Attends Religious Services:    Active Member of Clubs or  Organizations:    Attends Music therapist:    Marital Status:   Intimate Partner Violence:    Fear of Current or Ex-Partner:    Emotionally Abused:    Physically Abused:    Sexually Abused:      Review of Systems: General: negative for chills, fever, night sweats or weight changes.  Cardiovascular: negative for chest pain, dyspnea on exertion, edema, orthopnea, palpitations, paroxysmal nocturnal dyspnea or shortness of breath Dermatological: negative for rash Respiratory: negative for cough or wheezing Urologic: negative for hematuria Abdominal: negative for nausea, vomiting, diarrhea, bright red blood per rectum, melena, or hematemesis Neurologic: negative for visual changes, syncope, or dizziness All other systems reviewed and are  otherwise negative except as noted above.    Blood pressure 140/80, pulse 83, height 5\' 4"  (1.626 m), weight 179 lb (81.2 kg).  General appearance: alert and no distress Neck: no adenopathy, no carotid bruit, no JVD, supple, symmetrical, trachea midline and thyroid not enlarged, symmetric, no tenderness/mass/nodules Lungs: clear to auscultation bilaterally Heart: regular rate and rhythm, S1, S2 normal, no murmur, click, rub or gallop Extremities: extremities normal, atraumatic, no cyanosis or edema Pulses: 2+ and symmetric Skin: Skin color, texture, turgor normal. No rashes or lesions Neurologic: Alert and oriented X 3, normal strength and tone. Normal symmetric reflexes. Normal coordination and gait  EKG sinus rhythm 83 with evidence of LVH.  I personally reviewed this EKG.  ASSESSMENT AND PLAN:   Atypical chest pain Cynthia Brewer gets episodes of left inframammary chest pain lasting for seconds to minutes at a time several times a month.  She does have positive risk factors.  I am going to get an exercise Myoview stress test to further risk stratify her.  Dyspnea on exertion Several month history of dyspnea exertion, remote history of tobacco abuse having quit 20 years ago.  Go to get a 2D echo to further evaluate  Hyperlipidemia History of hyperlipidemia on statin therapy with lipid profile performed 06/02/2019 revealing total cholesterol 221, HDL of 64 and LDL of 116.  This is followed by her PCP  Essential hypertension History of essential hypertension with blood pressure measured today at 140/80.  She is on amlodipine, losartan and hydrochlorothiazide.      Lorretta Harp MD FACP,FACC,FAHA, [facility] 08/28/2019 12:05 PM

## 2019-08-28 NOTE — Patient Instructions (Signed)
Medication Instructions:  The current medical regimen is effective;  continue present plan and medications.  *If you need a refill on your cardiac medications before your next appointment, please call your pharmacy*   Testing/Procedures: Echocardiogram - Your physician has requested that you have an echocardiogram. Echocardiography is a painless test that uses sound waves to create images of your heart. It provides your doctor with information about the size and shape of your heart and how well your heart's chambers and valves are working. This procedure takes approximately one hour. There are no restrictions for this procedure. This will be performed at our [facility] location - [address].  Your physician has requested that you have an Exercise Myoview. A cardiac stress test is a cardiological test that measures the heart's ability to respond to external stress in a controlled clinical environment. The stress response is induced by exercise (exercise-treadmill). For further information please visit [email or web address]. If you have questions or concerns about your appointment, you can call the Nuclear Lab at [phone].    Follow-Up: At [facility], you and your health needs are our priority.  As part of our continuing mission to provide you with exceptional heart care, we have created designated Provider Care Teams.  These Care Teams include your primary Cardiologist (physician) and Advanced Practice Providers (APPs -  Physician Assistants and Nurse Practitioners) who all work together to provide you with the care you need, when you need it.  We recommend signing up for the patient portal called "MyChart".  Sign up information is provided on this After Visit Summary.  MyChart is used to connect with patients for Virtual Visits (Telemedicine).  Patients are able to view lab/test results, encounter notes, upcoming appointments, etc.  Non-urgent messages can be sent to your  provider as well.   To learn more about what you can do with MyChart, go to [email or web address].    Your next appointment:   As needed  The format for your next appointment:   In Person  Provider:   Quay Burow, MD

## 2019-08-28 NOTE — Assessment & Plan Note (Signed)
Cynthia Brewer gets episodes of left inframammary chest pain lasting for seconds to minutes at a time several times a month.  She does have positive risk factors.  I am going to get an exercise Myoview stress test to further risk stratify her.

## 2019-08-28 NOTE — Addendum Note (Signed)
Addended by: Caprice Beaver T on: 08/28/2019 12:18 PM   Modules accepted: Orders

## 2019-09-03 NOTE — Addendum Note (Signed)
Addended by: Zebedee Iba on: 09/03/2019 01:18 PM   Modules accepted: Orders

## 2019-09-15 ENCOUNTER — Encounter: Payer: Self-pay | Admitting: Internal Medicine

## 2019-09-16 ENCOUNTER — Ambulatory Visit: Payer: Medicare Other | Admitting: Acute Care

## 2019-09-26 ENCOUNTER — Other Ambulatory Visit (HOSPITAL_COMMUNITY): Payer: Medicare Other

## 2019-09-30 ENCOUNTER — Ambulatory Visit: Payer: Medicare Other | Admitting: Acute Care

## 2019-10-15 ENCOUNTER — Ambulatory Visit: Payer: Medicare Other

## 2019-10-15 ENCOUNTER — Other Ambulatory Visit: Payer: Self-pay

## 2019-10-15 DIAGNOSIS — G4733 Obstructive sleep apnea (adult) (pediatric): Secondary | ICD-10-CM | POA: Diagnosis not present

## 2019-10-16 ENCOUNTER — Telehealth (HOSPITAL_COMMUNITY): Payer: Self-pay

## 2019-10-16 NOTE — Telephone Encounter (Signed)
Encounter complete. 

## 2019-10-19 ENCOUNTER — Ambulatory Visit (HOSPITAL_COMMUNITY): Payer: Medicare Other | Attending: Cardiology

## 2019-10-19 ENCOUNTER — Other Ambulatory Visit: Payer: Self-pay

## 2019-10-19 DIAGNOSIS — R0789 Other chest pain: Secondary | ICD-10-CM | POA: Insufficient documentation

## 2019-10-19 DIAGNOSIS — R0609 Other forms of dyspnea: Secondary | ICD-10-CM

## 2019-10-19 DIAGNOSIS — R0600 Dyspnea, unspecified: Secondary | ICD-10-CM | POA: Insufficient documentation

## 2019-10-19 LAB — ECHOCARDIOGRAM COMPLETE
Area-P 1/2: 6.54 cm2
S' Lateral: 2.8 cm

## 2019-10-20 ENCOUNTER — Telehealth: Payer: Self-pay | Admitting: Cardiovascular Disease

## 2019-10-20 NOTE — Telephone Encounter (Signed)
    Pt is requesting a copy of her echo result send to her mailing address. Address on file is verified

## 2019-10-20 NOTE — Telephone Encounter (Signed)
Spoke to patient echo report mailed.

## 2019-10-21 ENCOUNTER — Encounter (HOSPITAL_COMMUNITY): Payer: Medicare Other

## 2019-10-21 ENCOUNTER — Ambulatory Visit (HOSPITAL_COMMUNITY)
Admission: RE | Admit: 2019-10-21 | Payer: Medicare Other | Source: Ambulatory Visit | Attending: Cardiovascular Disease | Admitting: Cardiovascular Disease

## 2019-10-27 DIAGNOSIS — G4733 Obstructive sleep apnea (adult) (pediatric): Secondary | ICD-10-CM | POA: Diagnosis not present

## 2019-11-12 ENCOUNTER — Ambulatory Visit (INDEPENDENT_AMBULATORY_CARE_PROVIDER_SITE_OTHER): Payer: Medicare Other | Admitting: Emergency Medicine

## 2019-11-12 ENCOUNTER — Ambulatory Visit (INDEPENDENT_AMBULATORY_CARE_PROVIDER_SITE_OTHER): Payer: Medicare Other | Admitting: Pulmonary Disease

## 2019-11-12 ENCOUNTER — Encounter: Payer: Self-pay | Admitting: Pulmonary Disease

## 2019-11-12 ENCOUNTER — Other Ambulatory Visit: Payer: Self-pay

## 2019-11-12 VITALS — BP 122/70 | HR 85 | Ht 65.0 in | Wt 181.0 lb

## 2019-11-12 DIAGNOSIS — R0600 Dyspnea, unspecified: Secondary | ICD-10-CM

## 2019-11-12 DIAGNOSIS — R05 Cough: Secondary | ICD-10-CM

## 2019-11-12 DIAGNOSIS — R0609 Other forms of dyspnea: Secondary | ICD-10-CM

## 2019-11-12 DIAGNOSIS — J449 Chronic obstructive pulmonary disease, unspecified: Secondary | ICD-10-CM

## 2019-11-12 DIAGNOSIS — J4489 Other specified chronic obstructive pulmonary disease: Secondary | ICD-10-CM

## 2019-11-12 DIAGNOSIS — R053 Chronic cough: Secondary | ICD-10-CM

## 2019-11-12 DIAGNOSIS — G4733 Obstructive sleep apnea (adult) (pediatric): Secondary | ICD-10-CM

## 2019-11-12 LAB — PULMONARY FUNCTION TEST
DL/VA % pred: 94 %
DL/VA: 3.87 ml/min/mmHg/L
DLCO cor % pred: 64 %
DLCO cor: 13.22 ml/min/mmHg
DLCO unc % pred: 64 %
DLCO unc: 13.22 ml/min/mmHg
FEF 25-75 Post: 1.8 L/sec
FEF 25-75 Pre: 1.11 L/sec
FEF2575-%Change-Post: 61 %
FEF2575-%Pred-Post: 97 %
FEF2575-%Pred-Pre: 60 %
FEV1-%Change-Post: 12 %
FEV1-%Pred-Post: 66 %
FEV1-%Pred-Pre: 59 %
FEV1-Post: 1.32 L
FEV1-Pre: 1.18 L
FEV1FVC-%Change-Post: 4 %
FEV1FVC-%Pred-Pre: 104 %
FEV6-%Change-Post: 6 %
FEV6-%Pred-Post: 63 %
FEV6-%Pred-Pre: 58 %
FEV6-Post: 1.56 L
FEV6-Pre: 1.46 L
FEV6FVC-%Pred-Post: 103 %
FEV6FVC-%Pred-Pre: 103 %
FVC-%Change-Post: 6 %
FVC-%Pred-Post: 60 %
FVC-%Pred-Pre: 56 %
FVC-Post: 1.56 L
FVC-Pre: 1.46 L
Post FEV1/FVC ratio: 85 %
Post FEV6/FVC ratio: 100 %
Pre FEV1/FVC ratio: 81 %
Pre FEV6/FVC Ratio: 100 %
RV % pred: 91 %
RV: 2.05 L
TLC % pred: 75 %
TLC: 3.97 L

## 2019-11-12 MED ORDER — BUDESONIDE-FORMOTEROL FUMARATE 80-4.5 MCG/ACT IN AERO: 2.0000 | INHALATION_SPRAY | Freq: Two times a day (BID) | RESPIRATORY_TRACT | 12 refills | Status: DC

## 2019-11-12 NOTE — Assessment & Plan Note (Signed)
Plan: Resume daily antihistamine Lab work today Resume Symbicort 80 Follow-up in 2 to 3 months

## 2019-11-12 NOTE — Patient Instructions (Addendum)
You were seen today by Lauraine Rinne, NP  for:   1. DOE (dyspnea on exertion)  - IgE; Future - CBC with Differential/Platelet; Future  2. OSA (obstructive sleep apnea)  Your home sleep study showed mild obstructive sleep apnea  We would recommend the start CPAP therapy  New CPAP ordered DME: [employer] APAP setting 5-15 Mask of choice  We recommend that you start CPAP daily >>>Keep up the hard work using your device >>> Goal should be wearing this for the entire night that you are sleeping, at least 4 to 6 hours  Remember:  . Do not drive or operate heavy machinery if tired or drowsy.  . Please notify the supply company and office if you are unable to use your device regularly due to missing supplies or machine being broken.  . Work on maintaining a healthy weight and following your recommended nutrition plan  . Maintain proper daily exercise and movement  . Maintaining proper use of your device can also help improve management of other chronic illnesses such as: Blood pressure, blood sugars, and weight management.   BiPAP/ CPAP Cleaning:  >>>Clean weekly, with Dawn soap, and bottle brush.  Set up to air dry. >>> Wipe mask out daily with wet wipe or towelette    3. Asthma 4. Chronic cough  - IgE; Future - CBC with Differential/Platelet; Future  Restart Symbicort 80 >>> 2 puffs in the morning right when you wake up, rinse out your mouth after use, 12 hours later 2 puffs, rinse after use >>> Take this daily, no matter what >>> This is not a rescue inhaler   Only use your albuterol as a rescue medication to be used if you can't catch your breath by resting or doing a relaxed purse lip breathing pattern.  - The less you use it, the better it will work when you need it. - Ok to use up to 2 puffs  every 4 hours if you must but call for immediate appointment if use goes up over your usual need - Don't leave home without it !!  (think of it like the spare tire for your car)   Lab  work today  Please start taking a daily antihistamine:  >>>choose one of: zyrtec, claritin, allegra, or xyzal  >>>these are over the counter medications  >>>can choose generic option  >>>take daily  >>>this medication helps with allergies, post nasal drip, and cough   We recommend today:  Orders Placed This Encounter  Procedures  . IgE    Standing Status:   Future    Standing Expiration Date:   11/11/2020  . CBC with Differential/Platelet    Standing Status:   Future    Standing Expiration Date:   11/11/2020   Orders Placed This Encounter  Procedures  . IgE  . CBC with Differential/Platelet   No orders of the defined types were placed in this encounter.   Follow Up:    Return in about 3 months (around 02/12/2020), or if symptoms worsen or fail to improve, for Follow up with Dr. Ander Slade, [place]. New patient to Dr. Jenetta Downer.  Former Dr. Tamala Julian        Please do your part to reduce the spread of COVID-19:      Reduce your risk of any infection  and COVID19 by using the similar precautions used for avoiding the common cold or flu:  Marland Kitchen Wash your hands often with soap and warm water for at least 20 seconds.  If soap and water are not readily available, use an alcohol-based hand sanitizer with at least 60% alcohol.  . If coughing or sneezing, cover your mouth and nose by coughing or sneezing into the elbow areas of your shirt or coat, into a tissue or into your sleeve (not your hands). Langley Gauss A MASK when in public  . Avoid shaking hands with others and consider head nods or verbal greetings only. . Avoid touching your eyes, nose, or mouth with unwashed hands.  . Avoid close contact with people who are sick. . Avoid places or events with large numbers of people in one location, like concerts or sporting events. . If you have some symptoms but not all symptoms, continue to monitor at home and seek medical attention if your symptoms worsen. . If you are having a medical  emergency, call 911.   [place] / e-Visit: [email or web address]         [facility]: [facility]: [phone]                   [facility]: [phone]     It is flu season:   >>> Best ways to protect herself from the flu: Receive the yearly flu vaccine, practice good hand hygiene washing with soap and also using hand sanitizer when available, eat a nutritious meals, get adequate rest, hydrate appropriately   Please contact the office if your symptoms worsen or you have concerns that you are not improving.   Thank you for choosing [facility] for your healthcare, and for allowing [place] to partner with you on your healthcare journey. I am thankful to be able to provide care to you today.   Wyn Quaker FNP-C    Sleep Apnea Sleep apnea affects breathing during sleep. It causes breathing to stop for a short time or to become shallow. It can also increase the risk of:  Heart attack.  Stroke.  Being very overweight (obese).  Diabetes.  Heart failure.  Irregular heartbeat. The goal of treatment is to help you breathe normally again. What are the causes? There are three kinds of sleep apnea:  Obstructive sleep apnea. This is caused by a blocked or collapsed airway.  Central sleep apnea. This happens when the brain does not send the right signals to the muscles that control breathing.  Mixed sleep apnea. This is a combination of obstructive and central sleep apnea. The most common cause of this condition is a collapsed or blocked airway. This can happen if:  Your throat muscles are too relaxed.  Your tongue and tonsils are too large.  You are overweight.  Your airway is too small. What increases the risk?  Being overweight.  Smoking.  Having a small airway.  Being older.  Being female.  Drinking  alcohol.  Taking medicines to calm yourself (sedatives or tranquilizers).  Having family members with the condition. What are the signs or symptoms?  Trouble staying asleep.  Being sleepy or tired during the day.  Getting angry a lot.  Loud snoring.  Headaches in the morning.  Not being able to focus your mind (concentrate).  Forgetting things.  Less interest in sex.  Mood swings.  Personality changes.  Feelings of sadness (depression).  Waking up a lot during the night to pee (urinate).  Dry mouth.  Sore throat. How is this diagnosed?  Your medical history.  A physical exam.  A test that is done when you are sleeping (sleep study). The test is most often done in a sleep lab but may also be done at home. How is this treated?   Sleeping on your side.  Using a medicine to get rid of mucus in your nose (decongestant).  Avoiding the use of alcohol, medicines to help you relax, or certain pain medicines (narcotics).  Losing weight, if needed.  Changing your diet.  Not smoking.  Using a machine to open your airway while you sleep, such as: ? An oral appliance. This is a mouthpiece that shifts your lower jaw forward. ? A CPAP device. This device blows air through a mask when you breathe out (exhale). ? An EPAP device. This has valves that you put in each nostril. ? A BPAP device. This device blows air through a mask when you breathe in (inhale) and breathe out.  Having surgery if other treatments do not work. It is important to get treatment for sleep apnea. Without treatment, it can lead to:  High blood pressure.  Coronary artery disease.  In men, not being able to have an erection (impotence).  Reduced thinking ability. Follow these instructions at home: Lifestyle  Make changes that your doctor recommends.  Eat a healthy diet.  Lose weight if needed.  Avoid alcohol, medicines to help you relax, and some pain medicines.  Do not use any  products that contain nicotine or tobacco, such as cigarettes, e-cigarettes, and chewing tobacco. If you need help quitting, ask your doctor. General instructions  Take over-the-counter and prescription medicines only as told by your doctor.  If you were given a machine to use while you sleep, use it only as told by your doctor.  If you are having surgery, make sure to tell your doctor you have sleep apnea. You may need to bring your device with you.  Keep all follow-up visits as told by your doctor. This is important. Contact a doctor if:  The machine that you were given to use during sleep bothers you or does not seem to be working.  You do not get better.  You get worse. Get help right away if:  Your chest hurts.  You have trouble breathing in enough air.  You have an uncomfortable feeling in your back, arms, or stomach.  You have trouble talking.  One side of your body feels weak.  A part of your face is hanging down. These symptoms may be an emergency. Do not wait to see if the symptoms will go away. Get medical help right away. Call your local emergency services (911 in the [place] Do not drive yourself to the hospital. Summary  This condition affects breathing during sleep.  The most common cause is a collapsed or blocked airway.  The goal of treatment is to help you breathe normally while you sleep. This information is not intended to replace advice given to you by your health care provider. Make sure you discuss any questions you have with your health care provider. Document Revised: 12/20/2017 Document Reviewed: 10/29/2017 Elsevier Patient Education  [place].

## 2019-11-12 NOTE — Assessment & Plan Note (Signed)
Reviewed home sleep study today with patient Mild obstructive sleep apnea Patient is agreeable to starting CPAP therapy  Plan: We will order CPAP today APAP setting 5-15 Mask of choice We will get patient set up to establish care with Dr. Jenetta Downer

## 2019-11-12 NOTE — Addendum Note (Signed)
Addended by: Suzzanne Cloud E on: 11/12/2019 04:36 PM   Modules accepted: Orders

## 2019-11-12 NOTE — Assessment & Plan Note (Signed)
Shortness of breath likely multifactorial  Plan: Continue follow-up with cardiology Proceed forward with myocardial perfusion study Resume Symbicort 80 Lab work today Start CPAP therapy

## 2019-11-12 NOTE — Progress Notes (Signed)
Full PFT performed today. °

## 2019-11-12 NOTE — Assessment & Plan Note (Addendum)
Mild emphysema seen on CT imaging No formal obstruction on pulmonary function testing  Discussion: More than likely this is asthma COPD overlap syndrome given allergic rhinitis symptoms  Plan: Lab work today [employer] 80 Continue to clinically monitor Start daily and histamine Based off lab work could consider starting Singulair

## 2019-11-12 NOTE — Progress Notes (Signed)
@Patient  ID: Cynthia Brewer, female    DOB: 1950-12-27, 69 y.o.   MRN: [id number]  Chief Complaint  Patient presents with  . Follow-up    PFT performed today.  Pt states that she has been doing good since last visit. States that she does become SOB with exertion and also has had an occ cough.    Referring provider: [facility]  HPI:  69 year old female former smoker initially referred to office in May/2021 for dyspnea on exertion  PMH: Hyperlipidemia, hypertension Smoker/ Smoking History: Former smoker. Quit 2008. 15-pack-year smoking history. Maintenance:  Symbicort  Pt of: Dr. Tamala Julian  11/12/2019  - Visit   69 year old female former smoker initially referred to our office in May/2021 for dyspnea on exertion. She is followed by Dr. Tamala Julian. She is presenting today after completing pulmonary function testing. Initial recommendations by Dr. Tamala Julian in May/2021 refer her to remain on Symbicort, continue Singulair, Pepcid as well as Zyrtec, Lab work, cardiology referral, home sleep study, pulmonary function testing.  Patient's pulmonary function testing from today is listed below:  11/12/2019-pulmonary function test-FVC 1.46 (56% predicted), postbronchodilator ratio 85, postbronchodilator FEV1 1.32 (66% predicted), mid flow reversibility, TLC 3.97 (75% predicted), DLCO 13.22 (64% predicted)  Patient has been evaluated by cardiology in June/2021 assessment and plan from that office visit is listed below  ASSESSMENT AND PLAN:   Atypical chest pain Ms. Yingling gets episodes of left inframammary chest pain lasting for seconds to minutes at a time several times a month.  She does have positive risk factors.  I am going to get an exercise Myoview stress test to further risk stratify her.  Dyspnea on exertion Several month history of dyspnea exertion, remote history of tobacco abuse having quit 20 years ago.  Go to get a 2D echo to further evaluate  Hyperlipidemia History of  hyperlipidemia on statin therapy with lipid profile performed 06/02/2019 revealing total cholesterol 221, HDL of 64 and LDL of 116.  This is followed by her PCP  Essential hypertension History of essential hypertension with blood pressure measured today at 140/80.  She is on amlodipine, losartan and hydrochlorothiazide.  It appears that patient was scheduled for a myocardial perfusion study. This appears to been canceled by the patient on 10/21/2019.  She is reporting today that is been rescheduled next month.  She is unsure when she last took her Symbicort.  She has been off of this for about a month.  She continues to have allergy-like symptoms.  She has not taken Zyrtec or Singulair in over a year per her and her husband.  She has not had her sleep study results reviewed with her today.  We will do that.    Questionaires / Pulmonary Flowsheets:   ACT:  No flowsheet data found.  MMRC: No flowsheet data found.  Epworth:  No flowsheet data found.  Tests:   11/12/2019-pulmonary function test-FVC 1.46 (56% predicted), postbronchodilator ratio 85, postbronchodilator FEV1 1.32 (66% predicted), mid flow reversibility, TLC 3.97 (75% predicted), DLCO 13.22 (64% predicted)  10/15/2019-home sleep study-AHI 12.6, SaO2 low 79%  10/19/2019-echocardiogram-LV ejection fraction 55 to 73%, grade 2 diastolic dysfunction, right ventricular systolic function is normal, unable to assess PA pressure   FENO:  No results found for: NITRICOXIDE  PFT: PFT Results Latest Ref Rng & Units 11/12/2019  FVC-Pre L 1.46  FVC-Predicted Pre % 56  FVC-Post L 1.56  FVC-Predicted Post % 60  Pre FEV1/FVC % % 81  Post FEV1/FCV % % 85  FEV1-Pre L 1.18  FEV1-Predicted Pre % 59  FEV1-Post L 1.32  DLCO uncorrected ml/min/mmHg 13.22  DLCO UNC% % 64  DLCO corrected ml/min/mmHg 13.22  DLCO COR %Predicted % 64  DLVA Predicted % 94  TLC L 3.97  TLC % Predicted % 75  RV % Predicted % 91    WALK:  No flowsheet data  found.  Imaging: ECHOCARDIOGRAM COMPLETE  Result Date: 10/19/2019    ECHOCARDIOGRAM REPORT   Patient Name:   ZAMANI CROCKER Date of Exam: 10/19/2019 Medical Rec #:  [id number]       Height:       64.0 in Accession #:    [id number]      Weight:       179.0 lb Date of Birth:  1951-01-19       BSA:          1.866 m Patient Age:    34 years        BP:           140/80 mmHg Patient Gender: F               HR:           84 bpm. Exam Location:  [facility] Procedure: 2D Echo, Cardiac Doppler and Color Doppler Indications:    R07.89 Other chest pain; R06.00 DOE  History:        Patient has no prior history of Echocardiogram examinations.                 COPD; Risk Factors:Hypertension and Dyslipidemia. Chronic cough.  Sonographer:    Diamond Nickel RCS Referring Phys: [place]  1. Left ventricular ejection fraction, by estimation, is 55 to 60%. The left ventricle has normal function. The left ventricle has no regional wall motion abnormalities. There is mild left ventricular hypertrophy. Left ventricular diastolic parameters are consistent with Grade II diastolic dysfunction (pseudonormalization).  2. Right ventricular systolic function is normal. The right ventricular size is normal. Tricuspid regurgitation signal is inadequate for assessing PA pressure.  3. The mitral valve is normal in structure. Trivial mitral valve regurgitation. No evidence of mitral stenosis.  4. The aortic valve is tricuspid. Aortic valve regurgitation is not visualized. No aortic stenosis is present.  5. The inferior vena cava is normal in size with greater than 50% respiratory variability, suggesting right atrial pressure of 3 mmHg. FINDINGS  Left Ventricle: Left ventricular ejection fraction, by estimation, is 55 to 60%. The left ventricle has normal function. The left ventricle has no regional wall motion abnormalities. The left ventricular internal cavity size was normal in size. There is  mild left ventricular  hypertrophy. Left ventricular diastolic parameters are consistent with Grade II diastolic dysfunction (pseudonormalization). Right Ventricle: The right ventricular size is normal. No increase in right ventricular wall thickness. Right ventricular systolic function is normal. Tricuspid regurgitation signal is inadequate for assessing PA pressure. Left Atrium: Left atrial size was normal in size. Right Atrium: Right atrial size was normal in size. Pericardium: Trivial pericardial effusion is present. Mitral Valve: The mitral valve is normal in structure. Trivial mitral valve regurgitation. No evidence of mitral valve stenosis. Tricuspid Valve: The tricuspid valve is normal in structure. Tricuspid valve regurgitation is not demonstrated. Aortic Valve: The aortic valve is tricuspid. Aortic valve regurgitation is not visualized. No aortic stenosis is present. Pulmonic Valve: The pulmonic valve was normal in structure. Pulmonic valve regurgitation is not visualized. Aorta: The aortic root is  normal in size and structure. Venous: The inferior vena cava is normal in size with greater than 50% respiratory variability, suggesting right atrial pressure of 3 mmHg. IAS/Shunts: No atrial level shunt detected by color flow Doppler.  LEFT VENTRICLE PLAX 2D LVIDd:         3.90 cm  Diastology LVIDs:         2.80 cm  LV e' lateral:   7.29 cm/s LV PW:         1.50 cm  LV E/e' lateral: 11.3 LV IVS:        1.30 cm  LV e' medial:    8.27 cm/s LVOT diam:     1.90 cm  LV E/e' medial:  10.0 LV SV:         51 LV SV Index:   27 LVOT Area:     2.84 cm  RIGHT VENTRICLE RV Basal diam:  1.80 cm RV S prime:     10.70 cm/s TAPSE (M-mode): 1.7 cm LEFT ATRIUM             Index       RIGHT ATRIUM           Index LA diam:        3.20 cm 1.71 cm/m  RA Area:     13.50 cm LA Vol (A2C):   48.3 ml 25.88 ml/m RA Volume:   33.70 ml  18.06 ml/m LA Vol (A4C):   34.4 ml 18.43 ml/m LA Biplane Vol: 41.1 ml 22.02 ml/m  AORTIC VALVE LVOT Vmax:   76.40 cm/s  LVOT Vmean:  51.100 cm/s LVOT VTI:    0.181 m  AORTA Ao Root diam: 2.70 cm MITRAL VALVE MV Area (PHT): 6.54 cm    SHUNTS MV Decel Time: 116 msec    Systemic VTI:  0.18 m MV E velocity: 82.70 cm/s  Systemic Diam: 1.90 cm MV A velocity: 67.30 cm/s MV E/A ratio:  1.23 Loralie Champagne MD Electronically signed by Loralie Champagne MD Signature Date/Time: 10/19/2019/10:13:23 PM    Final     Lab Results:  CBC No results found for: WBC, RBC, HGB, HCT, PLT, MCV, MCH, MCHC, RDW, LYMPHSABS, MONOABS, EOSABS, BASOSABS  BMET    Component Value Date/Time   NA 138 10/23/2018 1407   K 5.1 10/23/2018 1407   CL 104 10/23/2018 1407   CO2 28 10/23/2018 1407   GLUCOSE 91 10/23/2018 1407   BUN 13 10/23/2018 1407   CREATININE 1.15 (H) 10/23/2018 1407   CALCIUM 9.0 10/23/2018 1407   GFRNONAA 49 (L) 10/23/2018 1407   GFRAA 57 (L) 10/23/2018 1407    BNP No results found for: BNP  ProBNP No results found for: PROBNP  Specialty Problems      Pulmonary Problems   Asthma-COPD overlap syndrome (HCC)   Chronic cough   DOE (dyspnea on exertion)    Dyspnea on exertion      OSA (obstructive sleep apnea)      Allergies  Allergen Reactions  . Penicillins Hives    Immunization History  Administered Date(s) Administered  . Influenza, High Dose Seasonal PF 01/11/2018  . Moderna SARS-COVID-2 Vaccination 05/15/2019, 06/12/2019  . Pneumococcal Polysaccharide-23 01/12/2017    Past Medical History:  Diagnosis Date  . Anxiety   . Arthritis    hands and knees  . COPD (chronic obstructive pulmonary disease) ([place])   . Depression   . GERD (gastroesophageal reflux disease)   . Hypertension   . Mass of right parotid gland  Tobacco History: Social History   Tobacco Use  Smoking Status Former Smoker  . Packs/day: 1.00  . Years: 15.00  . Pack years: 15.00  . Types: Cigarettes  . Quit date: 03/19/2006  . Years since quitting: 13.6  Smokeless Tobacco Never Used   Counseling given: Not  Answered   Continue to not smoke  Outpatient Encounter Medications as of 11/12/2019  Medication Sig  . amLODipine (NORVASC) 10 MG tablet Take 10 mg by mouth daily.  Marland Kitchen aripiprazole (ABILIFY) 10 MG disintegrating tablet Take 10 mg by mouth daily.  . cetirizine (ZYRTEC ALLERGY) 10 MG tablet Take 1 tablet (10 mg total) by mouth daily.  . famotidine (PEPCID) 20 MG tablet Take 1 tablet (20 mg total) by mouth 2 (two) times daily.  Marland Kitchen FLUoxetine (PROZAC) 40 MG capsule Take 40 mg by mouth daily.  . IBUPROFEN & ACETAMINOPHEN PO Take 1 tablet by mouth as needed.  Marland Kitchen losartan-hydrochlorothiazide (HYZAAR) 50-12.5 MG tablet Take 1 tablet by mouth daily.  . montelukast (SINGULAIR) 10 MG tablet Take 1 tablet (10 mg total) by mouth at bedtime.  Marland Kitchen omeprazole (PRILOSEC) 20 MG capsule Take 20 mg by mouth daily.  . pravastatin (PRAVACHOL) 10 MG tablet Take 10 mg by mouth daily.  . [DISCONTINUED] budesonide-formoterol (SYMBICORT) 160-4.5 MCG/ACT inhaler Inhale 2 puffs into the lungs 2 (two) times daily.  . budesonide-formoterol (SYMBICORT) 80-4.5 MCG/ACT inhaler Inhale 2 puffs into the lungs in the morning and at bedtime.   No facility-administered encounter medications on file as of 11/12/2019.     Review of Systems  Review of Systems  Constitutional: Negative for activity change, fatigue and fever.  HENT: Positive for congestion (clear / gray), postnasal drip and rhinorrhea. Negative for sinus pressure, sinus pain and sore throat.   Respiratory: Positive for cough and shortness of breath. Negative for wheezing.   Cardiovascular: Negative for chest pain and palpitations.  Gastrointestinal: Negative for diarrhea, nausea and vomiting.  Musculoskeletal: Negative for arthralgias.  Neurological: Negative for dizziness.  Psychiatric/Behavioral: Positive for sleep disturbance. The patient is not nervous/anxious.      Physical Exam  BP 122/70 (BP Location: Left Arm, Cuff Size: Normal)   Pulse 85   Ht 5\' 5"   (1.651 m)   Wt 181 lb (82.1 kg)   SpO2 99%   BMI 30.12 kg/m   Wt Readings from Last 5 Encounters:  11/12/19 181 lb (82.1 kg)  08/28/19 179 lb (81.2 kg)  08/05/19 179 lb (81.2 kg)  10/27/18 175 lb 14.8 oz (79.8 kg)  01/03/17 176 lb (79.8 kg)    BMI Readings from Last 5 Encounters:  11/12/19 30.12 kg/m  08/28/19 30.73 kg/m  08/05/19 28.04 kg/m  10/27/18 29.28 kg/m  01/03/17 29.29 kg/m     Physical Exam Vitals and nursing note reviewed.  Constitutional:      General: She is not in acute distress.    Appearance: Normal appearance.  HENT:     Head: Normocephalic and atraumatic.     Right Ear: Tympanic membrane, ear canal and external ear normal. There is no impacted cerumen.     Left Ear: Tympanic membrane, ear canal and external ear normal. There is no impacted cerumen.     Nose: Congestion and rhinorrhea present.     Mouth/Throat:     Mouth: Mucous membranes are moist.     Pharynx: Oropharynx is clear.     Comments: +post nasal drip, MP 4 Eyes:     Pupils: Pupils are equal, round, and reactive  to light.  Cardiovascular:     Rate and Rhythm: Normal rate and regular rhythm.     Pulses: Normal pulses.     Heart sounds: Normal heart sounds. No murmur heard.   Pulmonary:     Effort: Pulmonary effort is normal. No respiratory distress.     Breath sounds: Normal breath sounds. No decreased air movement. No decreased breath sounds, wheezing or rales.  Musculoskeletal:     Cervical back: Normal range of motion.  Skin:    General: Skin is warm and dry.     Capillary Refill: Capillary refill takes less than 2 seconds.  Neurological:     General: No focal deficit present.     Mental Status: She is alert and oriented to person, place, and time. Mental status is at baseline.     Gait: Gait normal.  Psychiatric:        Mood and Affect: Mood normal.        Behavior: Behavior normal.        Thought Content: Thought content normal.        Judgment: Judgment normal.        Assessment & Plan:   OSA (obstructive sleep apnea) Reviewed home sleep study today with patient Mild obstructive sleep apnea Patient is agreeable to starting CPAP therapy  Plan: We will order CPAP today APAP setting 5-15 Mask of choice We will get patient set up to establish care with Dr. Jenetta Downer  Asthma-COPD overlap syndrome ([place]) Mild emphysema seen on CT imaging No formal obstruction on pulmonary function testing  Discussion: More than likely this is asthma COPD overlap syndrome given allergic rhinitis symptoms  Plan: Lab work today [employer] 80 Continue to clinically monitor Start daily and histamine Based off lab work could consider starting Singulair   DOE (dyspnea on exertion) Shortness of breath likely multifactorial  Plan: Continue follow-up with cardiology Proceed forward with myocardial perfusion study Resume Symbicort 80 Lab work today Start CPAP therapy  Chronic cough Plan: Resume daily antihistamine Lab work today Resume Symbicort 80 Follow-up in 2 to 3 months    Return in about 3 months (around 02/12/2020), or if symptoms worsen or fail to improve, for Follow up with Dr. Ander Slade, [place].   Lauraine Rinne, NP 11/12/2019   This appointment required 32 minutes of patient care (this includes precharting, chart review, review of results, face-to-face care, etc.).

## 2019-11-13 ENCOUNTER — Telehealth: Payer: Self-pay | Admitting: Pulmonary Disease

## 2019-11-13 LAB — CBC WITH DIFFERENTIAL/PLATELET
Basophils Absolute: 0.1 10*3/uL (ref 0.0–0.1)
Basophils Relative: 0.7 % (ref 0.0–3.0)
Eosinophils Absolute: 0.3 10*3/uL (ref 0.0–0.7)
Eosinophils Relative: 2.8 % (ref 0.0–5.0)
HCT: 43.1 % (ref 36.0–46.0)
Hemoglobin: 14.2 g/dL (ref 12.0–15.0)
Lymphocytes Relative: 49.2 % — ABNORMAL HIGH (ref 12.0–46.0)
Lymphs Abs: 5 10*3/uL — ABNORMAL HIGH (ref 0.7–4.0)
MCHC: 32.8 g/dL (ref 30.0–36.0)
MCV: 93.5 fl (ref 78.0–100.0)
Monocytes Absolute: 0.6 10*3/uL (ref 0.1–1.0)
Monocytes Relative: 6.1 % (ref 3.0–12.0)
Neutro Abs: 4.2 10*3/uL (ref 1.4–7.7)
Neutrophils Relative %: 41.2 % — ABNORMAL LOW (ref 43.0–77.0)
Platelets: 247 10*3/uL (ref 150.0–400.0)
RBC: 4.62 Mil/uL (ref 3.87–5.11)
RDW: 13.7 % (ref 11.5–15.5)
WBC: 10.2 10*3/uL (ref 4.0–10.5)

## 2019-11-13 LAB — IGE: IgE (Immunoglobulin E), Serum: 1178 kU/L — ABNORMAL HIGH (ref <=114)

## 2019-11-13 NOTE — Telephone Encounter (Signed)
Pt is going to be a new cpap start. Called and spoke with pt letting her know that Rx for cpap had been sent to [place] and that they should call her once all has been processed to receive machine and she verbalized understanding. Nothing further needed.

## 2019-12-30 DIAGNOSIS — R350 Frequency of micturition: Secondary | ICD-10-CM | POA: Diagnosis not present

## 2019-12-30 DIAGNOSIS — E782 Mixed hyperlipidemia: Secondary | ICD-10-CM | POA: Diagnosis not present

## 2019-12-30 DIAGNOSIS — N3281 Overactive bladder: Secondary | ICD-10-CM | POA: Diagnosis not present

## 2019-12-30 DIAGNOSIS — F32A Depression, unspecified: Secondary | ICD-10-CM | POA: Diagnosis not present

## 2019-12-30 DIAGNOSIS — R21 Rash and other nonspecific skin eruption: Secondary | ICD-10-CM | POA: Diagnosis not present

## 2019-12-30 DIAGNOSIS — R519 Headache, unspecified: Secondary | ICD-10-CM | POA: Diagnosis not present

## 2019-12-30 DIAGNOSIS — I10 Essential (primary) hypertension: Secondary | ICD-10-CM | POA: Diagnosis not present

## 2019-12-31 ENCOUNTER — Inpatient Hospital Stay (HOSPITAL_COMMUNITY)
Admission: RE | Admit: 2019-12-31 | Payer: Medicare Other | Source: Ambulatory Visit | Attending: Cardiovascular Disease | Admitting: Cardiovascular Disease

## 2020-01-05 ENCOUNTER — Telehealth: Payer: Self-pay | Admitting: Pulmonary Disease

## 2020-01-06 ENCOUNTER — Ambulatory Visit: Payer: Medicare Other | Admitting: Cardiovascular Disease

## 2020-01-06 NOTE — Telephone Encounter (Signed)
Forwarding to [facility] per protocol

## 2020-01-06 NOTE — Telephone Encounter (Addendum)
I called [place] spoke to Thurmond Butts - he states pt is on waiting list and will probably be a couple of months before she can get machine.  [place] and got same info.  Called [facility] & spoke to [place] - they have machines and are getting in more today.  I sent order to them.  Called pt & made her aware.  Nothing further needed.

## 2020-01-29 ENCOUNTER — Telehealth (HOSPITAL_COMMUNITY): Payer: Self-pay | Admitting: *Deleted

## 2020-01-29 NOTE — Telephone Encounter (Signed)
Close encounter 

## 2020-02-03 ENCOUNTER — Ambulatory Visit (HOSPITAL_COMMUNITY)
Admission: RE | Admit: 2020-02-03 | Discharge: 2020-02-03 | Disposition: A | Discharge status: Home / Self Care | Payer: Medicare Other | Source: Ambulatory Visit | Attending: Cardiology | Admitting: Cardiology

## 2020-02-03 DIAGNOSIS — R0789 Other chest pain: Secondary | ICD-10-CM | POA: Insufficient documentation

## 2020-02-03 LAB — MYOCARDIAL PERFUSION IMAGING
LV dias vol: 96 mL (ref 46–106)
LV sys vol: 39 mL
Peak HR: 88 {beats}/min
Rest HR: 68 {beats}/min
SDS: 3
SRS: 0
SSS: 3
TID: 1.01

## 2020-02-03 MED ORDER — REGADENOSON 0.4 MG/5ML IV SOLN
0.4000 mg | Freq: Once | INTRAVENOUS | Status: AC
  Administered 2020-02-03: 0.4 mg via INTRAVENOUS

## 2020-02-03 MED ORDER — TECHNETIUM TC 99M TETROFOSMIN IV KIT
30.6000 | PACK | Freq: Once | INTRAVENOUS | Status: AC | PRN
  Administered 2020-02-03: 30.6 via INTRAVENOUS
  Filled 2020-02-03: qty 31

## 2020-02-03 MED ORDER — TECHNETIUM TC 99M TETROFOSMIN IV KIT
10.7000 | PACK | Freq: Once | INTRAVENOUS | Status: AC | PRN
  Administered 2020-02-03: 10.7 via INTRAVENOUS
  Filled 2020-02-03: qty 11

## 2020-02-13 DIAGNOSIS — J449 Chronic obstructive pulmonary disease, unspecified: Secondary | ICD-10-CM | POA: Diagnosis not present

## 2020-02-13 DIAGNOSIS — I10 Essential (primary) hypertension: Secondary | ICD-10-CM | POA: Diagnosis not present

## 2020-02-13 DIAGNOSIS — R9431 Abnormal electrocardiogram [ECG] [EKG]: Secondary | ICD-10-CM | POA: Diagnosis not present

## 2020-02-13 DIAGNOSIS — R112 Nausea with vomiting, unspecified: Secondary | ICD-10-CM | POA: Diagnosis not present

## 2020-02-13 DIAGNOSIS — Z87891 Personal history of nicotine dependence: Secondary | ICD-10-CM | POA: Diagnosis not present

## 2020-02-13 DIAGNOSIS — K920 Hematemesis: Secondary | ICD-10-CM | POA: Diagnosis not present

## 2020-02-13 DIAGNOSIS — K219 Gastro-esophageal reflux disease without esophagitis: Secondary | ICD-10-CM | POA: Diagnosis not present

## 2020-02-13 DIAGNOSIS — Z79899 Other long term (current) drug therapy: Secondary | ICD-10-CM | POA: Diagnosis not present

## 2020-02-13 DIAGNOSIS — K2970 Gastritis, unspecified, without bleeding: Secondary | ICD-10-CM | POA: Diagnosis not present

## 2020-02-13 DIAGNOSIS — K226 Gastro-esophageal laceration-hemorrhage syndrome: Secondary | ICD-10-CM | POA: Diagnosis not present

## 2020-02-13 DIAGNOSIS — E119 Type 2 diabetes mellitus without complications: Secondary | ICD-10-CM | POA: Diagnosis not present

## 2020-02-23 DIAGNOSIS — K5900 Constipation, unspecified: Secondary | ICD-10-CM | POA: Diagnosis not present

## 2020-02-23 DIAGNOSIS — K219 Gastro-esophageal reflux disease without esophagitis: Secondary | ICD-10-CM | POA: Diagnosis not present

## 2020-02-23 DIAGNOSIS — R1032 Left lower quadrant pain: Secondary | ICD-10-CM | POA: Diagnosis not present

## 2020-02-26 ENCOUNTER — Encounter (INDEPENDENT_AMBULATORY_CARE_PROVIDER_SITE_OTHER): Payer: Self-pay | Admitting: *Deleted

## 2020-03-02 DIAGNOSIS — E119 Type 2 diabetes mellitus without complications: Secondary | ICD-10-CM | POA: Diagnosis not present

## 2020-03-02 DIAGNOSIS — R3129 Other microscopic hematuria: Secondary | ICD-10-CM | POA: Diagnosis not present

## 2020-03-02 DIAGNOSIS — K219 Gastro-esophageal reflux disease without esophagitis: Secondary | ICD-10-CM | POA: Diagnosis not present

## 2020-03-02 DIAGNOSIS — K5900 Constipation, unspecified: Secondary | ICD-10-CM | POA: Diagnosis not present

## 2020-03-02 DIAGNOSIS — J449 Chronic obstructive pulmonary disease, unspecified: Secondary | ICD-10-CM | POA: Diagnosis not present

## 2020-03-02 DIAGNOSIS — I10 Essential (primary) hypertension: Secondary | ICD-10-CM | POA: Diagnosis not present

## 2020-03-02 DIAGNOSIS — Z87891 Personal history of nicotine dependence: Secondary | ICD-10-CM | POA: Diagnosis not present

## 2020-03-03 DIAGNOSIS — G4733 Obstructive sleep apnea (adult) (pediatric): Secondary | ICD-10-CM | POA: Diagnosis not present

## 2020-03-07 DIAGNOSIS — I10 Essential (primary) hypertension: Secondary | ICD-10-CM | POA: Diagnosis not present

## 2020-03-07 DIAGNOSIS — K5900 Constipation, unspecified: Secondary | ICD-10-CM | POA: Diagnosis not present

## 2020-04-03 DIAGNOSIS — G4733 Obstructive sleep apnea (adult) (pediatric): Secondary | ICD-10-CM | POA: Diagnosis not present

## 2020-04-05 DIAGNOSIS — G4733 Obstructive sleep apnea (adult) (pediatric): Secondary | ICD-10-CM | POA: Diagnosis not present

## 2020-04-26 DIAGNOSIS — Z1231 Encounter for screening mammogram for malignant neoplasm of breast: Secondary | ICD-10-CM | POA: Diagnosis not present

## 2020-04-27 DIAGNOSIS — G4733 Obstructive sleep apnea (adult) (pediatric): Secondary | ICD-10-CM | POA: Diagnosis not present

## 2020-05-04 DIAGNOSIS — G4733 Obstructive sleep apnea (adult) (pediatric): Secondary | ICD-10-CM | POA: Diagnosis not present

## 2020-05-05 DIAGNOSIS — G4733 Obstructive sleep apnea (adult) (pediatric): Secondary | ICD-10-CM | POA: Diagnosis not present

## 2020-05-20 ENCOUNTER — Other Ambulatory Visit (INDEPENDENT_AMBULATORY_CARE_PROVIDER_SITE_OTHER): Payer: Self-pay

## 2020-05-23 ENCOUNTER — Ambulatory Visit (INDEPENDENT_AMBULATORY_CARE_PROVIDER_SITE_OTHER): Payer: Medicare HMO | Admitting: Gastroenterology

## 2020-05-23 ENCOUNTER — Other Ambulatory Visit: Payer: Self-pay

## 2020-05-23 ENCOUNTER — Encounter (INDEPENDENT_AMBULATORY_CARE_PROVIDER_SITE_OTHER): Payer: Self-pay

## 2020-05-23 ENCOUNTER — Other Ambulatory Visit (INDEPENDENT_AMBULATORY_CARE_PROVIDER_SITE_OTHER): Payer: Self-pay

## 2020-05-23 ENCOUNTER — Encounter (INDEPENDENT_AMBULATORY_CARE_PROVIDER_SITE_OTHER): Payer: Self-pay | Admitting: Gastroenterology

## 2020-05-23 DIAGNOSIS — K581 Irritable bowel syndrome with constipation: Secondary | ICD-10-CM | POA: Diagnosis not present

## 2020-05-23 DIAGNOSIS — K920 Hematemesis: Secondary | ICD-10-CM | POA: Insufficient documentation

## 2020-05-23 DIAGNOSIS — K589 Irritable bowel syndrome without diarrhea: Secondary | ICD-10-CM | POA: Insufficient documentation

## 2020-05-23 NOTE — Patient Instructions (Signed)
Schedule EGD Start IBGard 1 tablet every 8-12 hours as needed for bloating Continue Colace for constipation Perform blood workup

## 2020-05-23 NOTE — Progress Notes (Signed)
Maylon Peppers, M.D. Gastroenterology & Hepatology [facility] [address] [place], [place] [address] Primary Care Physician: Lanelle Bal, PA-C [place] [address]  Referring MD: PCP  Chief Complaint: Constipation and episode of hematemesis  History of Present Illness: Cynthia Brewer is a 70 y.o. female with past medical history of COPD, anxiety, depression, GERD, hypertension and arthritis, who presents for evaluation of episodes of hematemesis and constipation.  Patient reports that two months ago she presented acute onset of vomiting multiple episodes. Reported some of the vomit had specks of blood. She also presented new onset of L flank abdominal pain which radiated to the mid abdomen.  She never had similar symptoms in the past. She reports that she was having significant constipation at that time as she did not have a bowel movement for couple weeks.Due to this, the patient decided to go to the ER at [facility] on 03/03/2020.  She underwent an abdominal x-ray that showed normal abdominal gas pattern with small amount of stool.  Urinalysis was performed which just showed trace blood but no other alteration.  The patient was considered to have a Mallory-Weiss tear clinically and as she did not have any more episode of vomiting she was given a prescription for MiraLAX and to continue her docusate 100 mg every day.  Since the last time she was seen in the ER, the patient states that she is having a BM every day with her current regimen. However, she feels bloated frequently which causes significant discomfort in her abdomen diffusely. Does not follow any diet at the moment. She reports the vomiting episodes completely resolved and has not had any more hematemesis. The patient denies having any fever, chills, hematochezia, melena, dyschezia, diarrhea, jaundice, pruritus or weight loss.  Notably, she reports intermittent  episodes of constipation throughout her life which she has managed with stool softeners through her life.  Patient takes Nexium once a day for GERD. This controls her symptoms almost 100% of the time, as very occasionally she has some episodes of heartburn.  Last RCV:[id number] years ago at [facility], normal per the patient. Last Colonoscopy: 3 years ago - done by Dr. Ladona Horns, no polyps per the patient but no reports   FHx: neg for any gastrointestinal/liver disease, throat cancer Social: quit smoking 30  Years ago, neg alcohol or illicit drug use Surgical: abdominal hernia repair, partial hysterectomy  Past Medical History: Past Medical History:  Diagnosis Date  . Anxiety   . Arthritis    hands and knees  . COPD (chronic obstructive pulmonary disease) ([place])   . Depression   . GERD (gastroesophageal reflux disease)   . Hypertension   . Mass of right parotid gland     Past Surgical History: Past Surgical History:  Procedure Laterality Date  . ABDOMINAL HYSTERECTOMY    . BREAST SURGERY Bilateral    breast bx  . CERVICAL SPINE SURGERY    . HERNIA REPAIR     UHR  . PAROTIDECTOMY Right 10/27/2018   Procedure: RIGHT PAROTIDECTOMY;  Surgeon: Leta Baptist, MD;  Location: [facility];  Service: ENT;  Laterality: Right;    Family History: Family History  Problem Relation Age of Onset  . Healthy Mother   . Healthy Father   . Depression Sister   . Hypertension Sister   . Sleep apnea Sister   . Healthy Brother   . Diabetes Sister   . Hypertension Sister   .  Throat cancer Brother   . Healthy Brother   . Autism Brother     Social History: Social History   Tobacco Use  Smoking Status Former Smoker  . Packs/day: 1.00  . Years: 15.00  . Pack years: 15.00  . Types: Cigarettes  . Quit date: 03/19/2006  . Years since quitting: 14.1  Smokeless Tobacco Never Used   Social History   Substance and Sexual Activity  Alcohol Use No   Social History   Substance  and Sexual Activity  Drug Use No    Allergies: Allergies  Allergen Reactions  . Penicillins Hives    Medications: Current Outpatient Medications  Medication Sig Dispense Refill  . amLODipine (NORVASC) 10 MG tablet Take 10 mg by mouth daily.    Marland Kitchen aripiprazole (ABILIFY) 10 MG disintegrating tablet Take 15 mg by mouth daily.    . cetirizine (ZYRTEC ALLERGY) 10 MG tablet Take 1 tablet (10 mg total) by mouth daily. 30 tablet 11  . docusate sodium (COLACE) 100 MG capsule Take 100 mg by mouth daily.    Marland Kitchen esomeprazole (NEXIUM) 40 MG capsule Take 40 mg by mouth daily at 12 noon.    . famotidine (PEPCID) 20 MG tablet Take 1 tablet (20 mg total) by mouth 2 (two) times daily. 60 tablet 11  . FLUoxetine (PROZAC) 40 MG capsule Take 40 mg by mouth daily.    . hydrochlorothiazide (MICROZIDE) 12.5 MG capsule Take 12.5 mg by mouth daily.    Marland Kitchen losartan (COZAAR) 50 MG tablet Take 50 mg by mouth daily.    . montelukast (SINGULAIR) 10 MG tablet Take 1 tablet (10 mg total) by mouth at bedtime. 30 tablet 11  . oxybutynin (DITROPAN) 5 MG tablet Take 5 mg by mouth daily.    . potassium chloride SA (KLOR-CON) 20 MEQ tablet Take 20 mEq by mouth once.    . pravastatin (PRAVACHOL) 10 MG tablet Take 10 mg by mouth daily.     No current facility-administered medications for this visit.    Review of Systems: GENERAL: negative for malaise, night sweats HEENT: No changes in hearing or vision, no nose bleeds or other nasal problems. NECK: Negative for lumps, goiter, pain and significant neck swelling RESPIRATORY: Negative for cough, wheezing CARDIOVASCULAR: Negative for chest pain, leg swelling, palpitations, orthopnea GI: SEE HPI MUSCULOSKELETAL: Negative for joint pain or swelling, back pain, and muscle pain. SKIN: Negative for lesions, rash PSYCH: Negative for sleep disturbance, mood disorder and recent psychosocial stressors. HEMATOLOGY Negative for prolonged bleeding, bruising easily, and swollen  nodes. ENDOCRINE: Negative for cold or heat intolerance, polyuria, polydipsia and goiter. NEURO: negative for tremor, gait imbalance, syncope and seizures. The remainder of the review of systems is noncontributory.   Physical Exam: BP (!) 154/74 (BP Location: Left Arm, Patient Position: Sitting, Cuff Size: Large)   Pulse 86   Temp 98.4 F (36.9 C) (Oral)   Ht 5\' 5"  (1.651 m)   Wt 181 lb (82.1 kg)   BMI 30.12 kg/m  GENERAL: The patient is AO x3, in no acute distress. Obese. HEENT: Head is normocephalic and atraumatic. EOMI are intact. Mouth is well hydrated and without lesions. NECK: Supple. No masses LUNGS: Clear to auscultation. No presence of rhonchi/wheezing/rales. Adequate chest expansion HEART: RRR, normal s1 and s2. ABDOMEN: Soft, nontender, no guarding, no peritoneal signs, and nondistended. BS +. No masses. EXTREMITIES: Without any cyanosis, clubbing, rash, lesions or edema. NEUROLOGIC: AOx3, no focal motor deficit. SKIN: no jaundice, no rashes   Imaging/Labs: as  above  I personally reviewed and interpreted the available labs, imaging and endoscopic files.  Impression and Plan: Cynthia Brewer is a 70 y.o. female with past medical history of COPD, anxiety, depression, GERD, hypertension and arthritis, who presents for evaluation of episodes of hematemesis and constipation.  Patient had an episode of severe vomiting which could be related to an episode of gastroenteritis as it resolved in a short period time, but I also consider that her worsening constipation may have led to many of her symptoms.  Fortunately, she has been able to improve her symptomatology while taking docusate.  I consider that she is exhibiting symptoms of IBS-C as she has presented the constipation chronically without any red flag signs.  She can continue taking the docusate, but she will benefit from implementing the use of IBgard to improve her symptoms of bloating.  If this does not help her, we may  need to implement a low FODMAP diet or use antispasmodics concomitantly.  We will check a TSH today to evaluate reversible causes of constipation.  I also explained to her that her episodes of hematemesis was likely related to Mallory-Weiss tear as she had multiple vomiting episodes.  We will need to investigate this further with esophagogastroduodenospy which will be scheduled today.  - Schedule EGD -Start IBGard 1 tablet every 8-12 hours as needed for bloating -Continue Colace for constipation -Check TSH  All questions were answered.      Maylon Peppers, MD Gastroenterology and Hepatology [facility]

## 2020-05-23 NOTE — H&P (View-Only) (Signed)
Maylon Peppers, M.D. Gastroenterology & Hepatology [facility] [address] [place], [place] [address] Primary Care Physician: Lanelle Bal, PA-C [place] [address]  Referring MD: PCP  Chief Complaint: Constipation and episode of hematemesis  History of Present Illness: Cynthia Brewer is a 70 y.o. female with past medical history of COPD, anxiety, depression, GERD, hypertension and arthritis, who presents for evaluation of episodes of hematemesis and constipation.  Patient reports that two months ago she presented acute onset of vomiting multiple episodes. Reported some of the vomit had specks of blood. She also presented new onset of L flank abdominal pain which radiated to the mid abdomen.  She never had similar symptoms in the past. She reports that she was having significant constipation at that time as she did not have a bowel movement for couple weeks.Due to this, the patient decided to go to the ER at [facility] on 03/03/2020.  She underwent an abdominal x-ray that showed normal abdominal gas pattern with small amount of stool.  Urinalysis was performed which just showed trace blood but no other alteration.  The patient was considered to have a Mallory-Weiss tear clinically and as she did not have any more episode of vomiting she was given a prescription for MiraLAX and to continue her docusate 100 mg every day.  Since the last time she was seen in the ER, the patient states that she is having a BM every day with her current regimen. However, she feels bloated frequently which causes significant discomfort in her abdomen diffusely. Does not follow any diet at the moment. She reports the vomiting episodes completely resolved and has not had any more hematemesis. The patient denies having any fever, chills, hematochezia, melena, dyschezia, diarrhea, jaundice, pruritus or weight loss.  Notably, she reports intermittent  episodes of constipation throughout her life which she has managed with stool softeners through her life.  Patient takes Nexium once a day for GERD. This controls her symptoms almost 100% of the time, as very occasionally she has some episodes of heartburn.  Last UEK:[id number] years ago at [facility], normal per the patient. Last Colonoscopy: 3 years ago - done by Dr. Ladona Horns, no polyps per the patient but no reports   FHx: neg for any gastrointestinal/liver disease, throat cancer Social: quit smoking 30  Years ago, neg alcohol or illicit drug use Surgical: abdominal hernia repair, partial hysterectomy  Past Medical History: Past Medical History:  Diagnosis Date  . Anxiety   . Arthritis    hands and knees  . COPD (chronic obstructive pulmonary disease) ([place])   . Depression   . GERD (gastroesophageal reflux disease)   . Hypertension   . Mass of right parotid gland     Past Surgical History: Past Surgical History:  Procedure Laterality Date  . ABDOMINAL HYSTERECTOMY    . BREAST SURGERY Bilateral    breast bx  . CERVICAL SPINE SURGERY    . HERNIA REPAIR     UHR  . PAROTIDECTOMY Right 10/27/2018   Procedure: RIGHT PAROTIDECTOMY;  Surgeon: Leta Baptist, MD;  Location: [facility];  Service: ENT;  Laterality: Right;    Family History: Family History  Problem Relation Age of Onset  . Healthy Mother   . Healthy Father   . Depression Sister   . Hypertension Sister   . Sleep apnea Sister   . Healthy Brother   . Diabetes Sister   . Hypertension Sister   .  Throat cancer Brother   . Healthy Brother   . Autism Brother     Social History: Social History   Tobacco Use  Smoking Status Former Smoker  . Packs/day: 1.00  . Years: 15.00  . Pack years: 15.00  . Types: Cigarettes  . Quit date: 03/19/2006  . Years since quitting: 14.1  Smokeless Tobacco Never Used   Social History   Substance and Sexual Activity  Alcohol Use No   Social History   Substance  and Sexual Activity  Drug Use No    Allergies: Allergies  Allergen Reactions  . Penicillins Hives    Medications: Current Outpatient Medications  Medication Sig Dispense Refill  . amLODipine (NORVASC) 10 MG tablet Take 10 mg by mouth daily.    Marland Kitchen aripiprazole (ABILIFY) 10 MG disintegrating tablet Take 15 mg by mouth daily.    . cetirizine (ZYRTEC ALLERGY) 10 MG tablet Take 1 tablet (10 mg total) by mouth daily. 30 tablet 11  . docusate sodium (COLACE) 100 MG capsule Take 100 mg by mouth daily.    Marland Kitchen esomeprazole (NEXIUM) 40 MG capsule Take 40 mg by mouth daily at 12 noon.    . famotidine (PEPCID) 20 MG tablet Take 1 tablet (20 mg total) by mouth 2 (two) times daily. 60 tablet 11  . FLUoxetine (PROZAC) 40 MG capsule Take 40 mg by mouth daily.    . hydrochlorothiazide (MICROZIDE) 12.5 MG capsule Take 12.5 mg by mouth daily.    Marland Kitchen losartan (COZAAR) 50 MG tablet Take 50 mg by mouth daily.    . montelukast (SINGULAIR) 10 MG tablet Take 1 tablet (10 mg total) by mouth at bedtime. 30 tablet 11  . oxybutynin (DITROPAN) 5 MG tablet Take 5 mg by mouth daily.    . potassium chloride SA (KLOR-CON) 20 MEQ tablet Take 20 mEq by mouth once.    . pravastatin (PRAVACHOL) 10 MG tablet Take 10 mg by mouth daily.     No current facility-administered medications for this visit.    Review of Systems: GENERAL: negative for malaise, night sweats HEENT: No changes in hearing or vision, no nose bleeds or other nasal problems. NECK: Negative for lumps, goiter, pain and significant neck swelling RESPIRATORY: Negative for cough, wheezing CARDIOVASCULAR: Negative for chest pain, leg swelling, palpitations, orthopnea GI: SEE HPI MUSCULOSKELETAL: Negative for joint pain or swelling, back pain, and muscle pain. SKIN: Negative for lesions, rash PSYCH: Negative for sleep disturbance, mood disorder and recent psychosocial stressors. HEMATOLOGY Negative for prolonged bleeding, bruising easily, and swollen  nodes. ENDOCRINE: Negative for cold or heat intolerance, polyuria, polydipsia and goiter. NEURO: negative for tremor, gait imbalance, syncope and seizures. The remainder of the review of systems is noncontributory.   Physical Exam: BP (!) 154/74 (BP Location: Left Arm, Patient Position: Sitting, Cuff Size: Large)   Pulse 86   Temp 98.4 F (36.9 C) (Oral)   Ht 5\' 5"  (1.651 m)   Wt 181 lb (82.1 kg)   BMI 30.12 kg/m  GENERAL: The patient is AO x3, in no acute distress. Obese. HEENT: Head is normocephalic and atraumatic. EOMI are intact. Mouth is well hydrated and without lesions. NECK: Supple. No masses LUNGS: Clear to auscultation. No presence of rhonchi/wheezing/rales. Adequate chest expansion HEART: RRR, normal s1 and s2. ABDOMEN: Soft, nontender, no guarding, no peritoneal signs, and nondistended. BS +. No masses. EXTREMITIES: Without any cyanosis, clubbing, rash, lesions or edema. NEUROLOGIC: AOx3, no focal motor deficit. SKIN: no jaundice, no rashes   Imaging/Labs: as  above  I personally reviewed and interpreted the available labs, imaging and endoscopic files.  Impression and Plan: Cynthia Brewer is a 70 y.o. female with past medical history of COPD, anxiety, depression, GERD, hypertension and arthritis, who presents for evaluation of episodes of hematemesis and constipation.  Patient had an episode of severe vomiting which could be related to an episode of gastroenteritis as it resolved in a short period time, but I also consider that her worsening constipation may have led to many of her symptoms.  Fortunately, she has been able to improve her symptomatology while taking docusate.  I consider that she is exhibiting symptoms of IBS-C as she has presented the constipation chronically without any red flag signs.  She can continue taking the docusate, but she will benefit from implementing the use of IBgard to improve her symptoms of bloating.  If this does not help her, we may  need to implement a low FODMAP diet or use antispasmodics concomitantly.  We will check a TSH today to evaluate reversible causes of constipation.  I also explained to her that her episodes of hematemesis was likely related to Mallory-Weiss tear as she had multiple vomiting episodes.  We will need to investigate this further with esophagogastroduodenospy which will be scheduled today.  - Schedule EGD -Start IBGard 1 tablet every 8-12 hours as needed for bloating -Continue Colace for constipation -Check TSH  All questions were answered.      Maylon Peppers, MD Gastroenterology and Hepatology [facility]

## 2020-05-24 LAB — TSH: TSH: 0.37 mIU/L — ABNORMAL LOW (ref 0.40–4.50)

## 2020-05-31 ENCOUNTER — Other Ambulatory Visit (HOSPITAL_COMMUNITY)
Admission: RE | Admit: 2020-05-31 | Discharge: 2020-05-31 | Disposition: A | Discharge status: Home / Self Care | Payer: Medicare HMO | Source: Ambulatory Visit | Attending: Gastroenterology | Admitting: Gastroenterology

## 2020-05-31 ENCOUNTER — Other Ambulatory Visit: Payer: Self-pay

## 2020-05-31 DIAGNOSIS — Z20822 Contact with and (suspected) exposure to covid-19: Secondary | ICD-10-CM | POA: Insufficient documentation

## 2020-05-31 DIAGNOSIS — Z01812 Encounter for preprocedural laboratory examination: Secondary | ICD-10-CM | POA: Insufficient documentation

## 2020-05-31 LAB — BASIC METABOLIC PANEL
Anion gap: 9 (ref 5–15)
BUN: 16 mg/dL (ref 8–23)
CO2: 27 mmol/L (ref 22–32)
Calcium: 8.8 mg/dL — ABNORMAL LOW (ref 8.9–10.3)
Chloride: 104 mmol/L (ref 98–111)
Creatinine, Ser: 1.09 mg/dL — ABNORMAL HIGH (ref 0.44–1.00)
GFR, Estimated: 55 mL/min — ABNORMAL LOW (ref >60)
Glucose, Bld: 102 mg/dL — ABNORMAL HIGH (ref 70–99)
Potassium: 3.9 mmol/L (ref 3.5–5.1)
Sodium: 140 mmol/L (ref 135–145)

## 2020-05-31 LAB — SARS CORONAVIRUS 2 (TAT 6-24 HRS): SARS Coronavirus 2: NEGATIVE

## 2020-06-01 ENCOUNTER — Other Ambulatory Visit: Payer: Self-pay

## 2020-06-01 ENCOUNTER — Ambulatory Visit (HOSPITAL_COMMUNITY): Payer: Medicare HMO | Admitting: Anesthesiology

## 2020-06-01 ENCOUNTER — Ambulatory Visit (HOSPITAL_COMMUNITY)
Admission: RE | Admit: 2020-06-01 | Discharge: 2020-06-01 | Disposition: A | Discharge status: Home / Self Care | Payer: Medicare HMO | Attending: Gastroenterology | Admitting: Gastroenterology

## 2020-06-01 ENCOUNTER — Encounter (HOSPITAL_COMMUNITY)
Admission: RE | Disposition: A | Discharge status: Home / Self Care | Payer: Self-pay | Source: Home / Self Care | Attending: Gastroenterology

## 2020-06-01 ENCOUNTER — Encounter (HOSPITAL_COMMUNITY): Payer: Self-pay | Admitting: Gastroenterology

## 2020-06-01 DIAGNOSIS — J449 Chronic obstructive pulmonary disease, unspecified: Secondary | ICD-10-CM | POA: Diagnosis not present

## 2020-06-01 DIAGNOSIS — K920 Hematemesis: Secondary | ICD-10-CM

## 2020-06-01 DIAGNOSIS — K449 Diaphragmatic hernia without obstruction or gangrene: Secondary | ICD-10-CM | POA: Diagnosis not present

## 2020-06-01 DIAGNOSIS — Z79899 Other long term (current) drug therapy: Secondary | ICD-10-CM | POA: Diagnosis not present

## 2020-06-01 DIAGNOSIS — K3189 Other diseases of stomach and duodenum: Secondary | ICD-10-CM | POA: Diagnosis not present

## 2020-06-01 DIAGNOSIS — K219 Gastro-esophageal reflux disease without esophagitis: Secondary | ICD-10-CM | POA: Diagnosis not present

## 2020-06-01 DIAGNOSIS — I10 Essential (primary) hypertension: Secondary | ICD-10-CM | POA: Insufficient documentation

## 2020-06-01 DIAGNOSIS — Z836 Family history of other diseases of the respiratory system: Secondary | ICD-10-CM | POA: Insufficient documentation

## 2020-06-01 DIAGNOSIS — Z8249 Family history of ischemic heart disease and other diseases of the circulatory system: Secondary | ICD-10-CM | POA: Insufficient documentation

## 2020-06-01 DIAGNOSIS — K5900 Constipation, unspecified: Secondary | ICD-10-CM | POA: Diagnosis not present

## 2020-06-01 DIAGNOSIS — Z87891 Personal history of nicotine dependence: Secondary | ICD-10-CM | POA: Insufficient documentation

## 2020-06-01 DIAGNOSIS — Z800 Family history of malignant neoplasm of digestive organs: Secondary | ICD-10-CM | POA: Diagnosis not present

## 2020-06-01 DIAGNOSIS — G4733 Obstructive sleep apnea (adult) (pediatric): Secondary | ICD-10-CM | POA: Diagnosis not present

## 2020-06-01 DIAGNOSIS — K208 Other esophagitis without bleeding: Secondary | ICD-10-CM | POA: Diagnosis not present

## 2020-06-01 DIAGNOSIS — D181 Lymphangioma, any site: Secondary | ICD-10-CM | POA: Insufficient documentation

## 2020-06-01 DIAGNOSIS — K2270 Barrett's esophagus without dysplasia: Secondary | ICD-10-CM | POA: Diagnosis not present

## 2020-06-01 DIAGNOSIS — Z810 Family history of intellectual disabilities: Secondary | ICD-10-CM | POA: Insufficient documentation

## 2020-06-01 DIAGNOSIS — Z833 Family history of diabetes mellitus: Secondary | ICD-10-CM | POA: Diagnosis not present

## 2020-06-01 DIAGNOSIS — Z880 Allergy status to penicillin: Secondary | ICD-10-CM | POA: Diagnosis not present

## 2020-06-01 HISTORY — PX: BIOPSY: SHX5522

## 2020-06-01 HISTORY — PX: POLYPECTOMY: SHX5525

## 2020-06-01 HISTORY — PX: ESOPHAGOGASTRODUODENOSCOPY (EGD) WITH PROPOFOL: SHX5813

## 2020-06-01 SURGERY — ESOPHAGOGASTRODUODENOSCOPY (EGD) WITH PROPOFOL
Anesthesia: General

## 2020-06-01 MED ORDER — PROPOFOL 500 MG/50ML IV EMUL
INTRAVENOUS | Status: DC | PRN
  Administered 2020-06-01: 150 ug/kg/min via INTRAVENOUS

## 2020-06-01 MED ORDER — LACTATED RINGERS IV SOLN: INTRAVENOUS | Status: DC

## 2020-06-01 MED ORDER — STERILE WATER FOR IRRIGATION IR SOLN
Status: DC | PRN
  Administered 2020-06-01: 100 mL

## 2020-06-01 MED ORDER — PROPOFOL 10 MG/ML IV BOLUS
INTRAVENOUS | Status: AC
  Filled 2020-06-01: qty 80

## 2020-06-01 MED ORDER — PROPOFOL 10 MG/ML IV BOLUS
INTRAVENOUS | Status: DC | PRN
  Administered 2020-06-01: 50 mg via INTRAVENOUS

## 2020-06-01 NOTE — Anesthesia Preprocedure Evaluation (Signed)
Anesthesia Evaluation  Patient identified by MRN, date of birth, ID band Patient awake    Reviewed: Allergy & Precautions, NPO status , Patient's Chart, lab work & pertinent test results  History of Anesthesia Complications Negative for: history of anesthetic complications  Airway Mallampati: III  TM Distance: >3 FB Neck ROM: Full    Dental  (+) Dental Advisory Given, Partial Upper, Missing   Pulmonary asthma , sleep apnea and Continuous Positive Airway Pressure Ventilation , COPD,  COPD inhaler, former smoker,    Pulmonary exam normal breath sounds clear to auscultation       Cardiovascular hypertension, Pt. on medications + DOE  Normal cardiovascular exam Rhythm:Regular Rate:Normal     Neuro/Psych PSYCHIATRIC DISORDERS Anxiety Depression    GI/Hepatic Neg liver ROS, GERD  Medicated and Controlled,  Endo/Other  negative endocrine ROS  Renal/GU negative Renal ROS     Musculoskeletal  (+) Arthritis ,   Abdominal   Peds  Hematology negative hematology ROS (+)   Anesthesia Other Findings   Reproductive/Obstetrics                             Anesthesia Physical Anesthesia Plan  ASA: II  Anesthesia Plan: General   Post-op Pain Management:    Induction:   PONV Risk Score and Plan: Propofol infusion and TIVA  Airway Management Planned: Nasal Cannula and Natural Airway  Additional Equipment:   Intra-op Plan:   Post-operative Plan:   Informed Consent: I have reviewed the patients History and Physical, chart, labs and discussed the procedure including the risks, benefits and alternatives for the proposed anesthesia with the patient or authorized representative who has indicated his/her understanding and acceptance.     Dental advisory given  Plan Discussed with: CRNA and Surgeon  Anesthesia Plan Comments:         Anesthesia Quick Evaluation

## 2020-06-01 NOTE — Interval H&P Note (Signed)
History and Physical Interval Note:  06/01/2020 9:31 AM Cynthia Brewer is a 70 y.o. female with past medical history of COPD, anxiety, depression, GERD, hypertension and arthritis, who presents for evaluation of hematemesis .  Patient reports feeling well.  Denies any more episodes hematemesis and has not presented more nausea or vomiting.  Denies any complaints of chest abdominal pain, distention, melena or hematochezia.  BP (!) 152/61   Pulse 81   Temp 98.2 F (36.8 C) (Oral)   Resp 13   SpO2 94%  GENERAL: The patient is AO x3, in no acute distress. HEENT: Head is normocephalic and atraumatic. EOMI are intact. Mouth is well hydrated and without lesions. NECK: Supple. No masses LUNGS: Clear to auscultation. No presence of rhonchi/wheezing/rales. Adequate chest expansion HEART: RRR, normal s1 and s2. ABDOMEN: Soft, nontender, no guarding, no peritoneal signs, and nondistended. BS +. No masses. EXTREMITIES: Without any cyanosis, clubbing, rash, lesions or edema. NEUROLOGIC: AOx3, no focal motor deficit. SKIN: no jaundice, no rashes   Cynthia Brewer  has presented today for surgery, with the diagnosis of Vomiting GERD Hematemesis.  The various methods of treatment have been discussed with the patient and family. After consideration of risks, benefits and other options for treatment, the patient has consented to  Procedure(s) with comments: ESOPHAGOGASTRODUODENOSCOPY (EGD) WITH PROPOFOL (N/A) - AM as a surgical intervention.  The patient's history has been reviewed, patient examined, no change in status, stable for surgery.  I have reviewed the patient's chart and labs.  Questions were answered to the patient's satisfaction.     Maylon Peppers [place]

## 2020-06-01 NOTE — Op Note (Signed)
[facility] Patient Name: Laurenashley Viar Procedure Date: 06/01/2020 9:28 AM MRN: [id number] Date of Birth: 1950-12-24 Attending MD: Maylon Peppers ,  CSN: [id number] Age: 70 Admit Type: Outpatient Procedure:                Upper GI endoscopy Indications:              Hematemesis Providers:                Maylon Peppers, Crystal Page, [employer] Risa Grill, Technician Referring MD:              Medicines:                Monitored Anesthesia Care Complications:            No immediate complications. Estimated Blood Loss:     Estimated blood loss: none. Procedure:                Pre-Anesthesia Assessment:                           - Prior to the procedure, a History and Physical                            was performed, and patient medications, allergies                            and sensitivities were reviewed. The patient's                            tolerance of previous anesthesia was reviewed.                           - The risks and benefits of the procedure and the                            sedation options and risks were discussed with the                            patient. All questions were answered and informed                            consent was obtained.                           - ASA Grade Assessment: II - A patient with mild                            systemic disease.                           After obtaining informed consent, the endoscope was                            passed under direct vision. Throughout the  procedure, the patient's blood pressure, pulse, and                            oxygen saturations were monitored continuously. The                            GIF-H190 ([id number]) was introduced through the                            mouth, and advanced to the second part of duodenum.                            The upper GI endoscopy was accomplished without                             difficulty. The patient tolerated the procedure                            well. Scope In: 9:40:15 AM Scope Out: 9:51:17 AM Total Procedure Duration: 0 hours 11 minutes 2 seconds  Findings:      The esophagus and gastroesophageal junction were examined with white       light and narrow band imaging (NBI). There were esophageal mucosal       changes suspicious for short-segment Barrett's esophagus, classified as       Barrett's stage C0-M1 per Prague criteria. These changes involved the       mucosa at the upper extent of the gastric folds (39 cm from the       incisors) extending to the Z-line (38 cm from the incisors). Two tongues       of salmon-colored mucosa were present from 38 to 39 cm. The maximum       longitudinal extent of these esophageal mucosal changes was 1 cm in       length. This was biopsied with a cold forceps for histology.      A 2 cm hiatal hernia was present.      The entire examined stomach was normal.      A single 5 mm mucosal nodule with a localized distribution was found in       the duodenal bulb. The polyp was removed with a cold snare. Resection       and retrieval were complete. Impression:               - Esophageal mucosal changes suspicious for                            short-segment Barrett's esophagus, classified as                            Barrett's stage C0-M1 per Prague criteria. Biopsied.                           - 2 cm hiatal hernia.                           - Normal stomach.                           -  Mucosal nodule found in the duodenum. Moderate Sedation:      Per Anesthesia Care Recommendation:           - Discharge patient to home (ambulatory).                           - Resume previous diet.                           - Await pathology results.                           - Continue present medications, including Nexium. Procedure Code(s):        --- Professional ---                           [phone], Esophagogastroduodenoscopy,  flexible,                            transoral; with removal of tumor(s), polyp(s), or                            other lesion(s) by snare technique                           43239, 57, Esophagogastroduodenoscopy, flexible,                            transoral; with biopsy, single or multiple Diagnosis Code(s):        --- Professional ---                           K22.70, Barrett's esophagus without dysplasia                           K44.9, Diaphragmatic hernia without obstruction or                            gangrene                           K31.89, Other diseases of stomach and duodenum                           K92.0, Hematemesis CPT copyright 2019 American Medical Association. All rights reserved. The codes documented in this report are preliminary and upon coder review may  be revised to meet current compliance requirements. Maylon Peppers, MD Maylon Peppers,  06/01/2020 9:58:26 AM This report has been signed electronically. Number of Addenda: 0

## 2020-06-01 NOTE — Transfer of Care (Signed)
Immediate Anesthesia Transfer of Care Note  Patient: Cynthia Brewer  Procedure(s) Performed: ESOPHAGOGASTRODUODENOSCOPY (EGD) WITH PROPOFOL (N/A ) POLYPECTOMY BIOPSY  Patient Location: PACU  Anesthesia Type:General  Level of Consciousness: awake, alert , oriented and patient cooperative  Airway & Oxygen Therapy: Patient Spontanous Breathing  Post-op Assessment: Report given to RN, Post -op Vital signs reviewed and stable and Patient moving all extremities  Post vital signs: Reviewed and stable  Last Vitals:  Vitals Value Taken Time  BP    Temp 36.4 C 06/01/20 0957  Pulse    Resp 19 06/01/20 0957  SpO2 96 % 06/01/20 0957    Last Pain:  Vitals:   06/01/20 0957  TempSrc: Oral  PainSc:       Patients Stated Pain Goal: 6 (94/32/00 3794)  Complications: No complications documented.

## 2020-06-01 NOTE — Anesthesia Postprocedure Evaluation (Signed)
Anesthesia Post Note  Patient: Cynthia Brewer  Procedure(s) Performed: ESOPHAGOGASTRODUODENOSCOPY (EGD) WITH PROPOFOL (N/A ) POLYPECTOMY BIOPSY  Patient location during evaluation: Phase II Anesthesia Type: General Level of consciousness: awake, oriented, awake and alert and patient cooperative Pain management: satisfactory to patient Vital Signs Assessment: post-procedure vital signs reviewed and stable Respiratory status: spontaneous breathing, respiratory function stable and nonlabored ventilation Cardiovascular status: stable Postop Assessment: no apparent nausea or vomiting Anesthetic complications: no   No complications documented.   Last Vitals:  Vitals:   06/01/20 0757 06/01/20 0957  BP: (!) 152/61   Pulse: 81   Resp: 13 19  Temp: 36.8 C (!) 36.4 C  SpO2: 94% 96%    Last Pain:  Vitals:   06/01/20 0957  TempSrc: Oral  PainSc:                  Willa Rough

## 2020-06-01 NOTE — Discharge Instructions (Addendum)
Upper Endoscopy, Adult, Care After This sheet gives you information about how to care for yourself after your procedure. Your health care provider may also give you more specific instructions. If you have problems or questions, contact your health care provider. What can I expect after the procedure? After the procedure, it is common to have:  A sore throat.  Mild stomach pain or discomfort.  Bloating.  Nausea. Follow these instructions at home:  Follow instructions from your health care provider about what to eat or drink after your procedure.  Return to your normal activities as told by your health care provider. Ask your health care provider what activities are safe for you.  Take over-the-counter and prescription medicines only as told by your health care provider.  If you were given a sedative during the procedure, it can affect you for several hours. Do not drive or operate machinery until your health care provider says that it is safe.  Keep all follow-up visits as told by your health care provider. This is important.   Contact a health care provider if you have:  A sore throat that lasts longer than one day.  Trouble swallowing. Get help right away if:  You vomit blood or your vomit looks like coffee grounds.  You have: ? A fever. ? Bloody, black, or tarry stools. ? A severe sore throat or you cannot swallow. ? Difficulty breathing. ? Severe pain in your chest or abdomen. Summary  After the procedure, it is common to have a sore throat, mild stomach discomfort, bloating, and nausea.  If you were given a sedative during the procedure, it can affect you for several hours. Do not drive or operate machinery until your health care provider says that it is safe.  Follow instructions from your health care provider about what to eat or drink after your procedure.  Return to your normal activities as told by your health care provider. This information is not intended to  replace advice given to you by your health care provider. Make sure you discuss any questions you have with your health care provider. Document Revised: 03/03/2019 Document Reviewed: 08/05/2017 Elsevier Patient Education  2021 [place] are being discharged to home.  Resume your previous diet.  We are waiting for your pathology results.  Continue your present medications.

## 2020-06-02 ENCOUNTER — Ambulatory Visit: Payer: Medicare Other | Admitting: Primary Care

## 2020-06-02 ENCOUNTER — Ambulatory Visit: Payer: Medicare Other | Admitting: Pulmonary Disease

## 2020-06-03 LAB — SURGICAL PATHOLOGY

## 2020-06-08 ENCOUNTER — Encounter (HOSPITAL_COMMUNITY): Payer: Self-pay | Admitting: Gastroenterology

## 2020-07-02 DIAGNOSIS — G4733 Obstructive sleep apnea (adult) (pediatric): Secondary | ICD-10-CM | POA: Diagnosis not present

## 2020-07-07 DIAGNOSIS — G4733 Obstructive sleep apnea (adult) (pediatric): Secondary | ICD-10-CM | POA: Diagnosis not present

## 2020-07-27 DIAGNOSIS — R69 Illness, unspecified: Secondary | ICD-10-CM | POA: Diagnosis not present

## 2020-07-27 DIAGNOSIS — Z6830 Body mass index (BMI) 30.0-30.9, adult: Secondary | ICD-10-CM | POA: Diagnosis not present

## 2020-07-27 DIAGNOSIS — E7849 Other hyperlipidemia: Secondary | ICD-10-CM | POA: Diagnosis not present

## 2020-07-27 DIAGNOSIS — K219 Gastro-esophageal reflux disease without esophagitis: Secondary | ICD-10-CM | POA: Diagnosis not present

## 2020-07-27 DIAGNOSIS — J440 Chronic obstructive pulmonary disease with acute lower respiratory infection: Secondary | ICD-10-CM | POA: Diagnosis not present

## 2020-07-27 DIAGNOSIS — I10 Essential (primary) hypertension: Secondary | ICD-10-CM | POA: Diagnosis not present

## 2020-07-27 DIAGNOSIS — L249 Irritant contact dermatitis, unspecified cause: Secondary | ICD-10-CM | POA: Diagnosis not present

## 2020-08-01 DIAGNOSIS — G4733 Obstructive sleep apnea (adult) (pediatric): Secondary | ICD-10-CM | POA: Diagnosis not present

## 2020-08-08 DIAGNOSIS — Z6830 Body mass index (BMI) 30.0-30.9, adult: Secondary | ICD-10-CM | POA: Diagnosis not present

## 2020-08-08 DIAGNOSIS — M25462 Effusion, left knee: Secondary | ICD-10-CM | POA: Diagnosis not present

## 2020-08-08 DIAGNOSIS — M23307 Other meniscus derangements, unspecified meniscus, left knee: Secondary | ICD-10-CM | POA: Diagnosis not present

## 2020-08-19 ENCOUNTER — Other Ambulatory Visit: Payer: Self-pay | Admitting: Internal Medicine

## 2020-08-19 DIAGNOSIS — R936 Abnormal findings on diagnostic imaging of limbs: Secondary | ICD-10-CM | POA: Diagnosis not present

## 2020-08-19 DIAGNOSIS — M1712 Unilateral primary osteoarthritis, left knee: Secondary | ICD-10-CM | POA: Diagnosis not present

## 2020-08-19 DIAGNOSIS — M25562 Pain in left knee: Secondary | ICD-10-CM | POA: Diagnosis not present

## 2020-08-19 DIAGNOSIS — M23304 Other meniscus derangements, unspecified medial meniscus, left knee: Secondary | ICD-10-CM | POA: Diagnosis not present

## 2020-08-19 DIAGNOSIS — M25462 Effusion, left knee: Secondary | ICD-10-CM | POA: Diagnosis not present

## 2020-08-25 DIAGNOSIS — M25462 Effusion, left knee: Secondary | ICD-10-CM | POA: Diagnosis not present

## 2020-08-25 DIAGNOSIS — M659 Synovitis and tenosynovitis, unspecified: Secondary | ICD-10-CM | POA: Diagnosis not present

## 2020-08-25 DIAGNOSIS — Z6831 Body mass index (BMI) 31.0-31.9, adult: Secondary | ICD-10-CM | POA: Diagnosis not present

## 2020-09-01 DIAGNOSIS — G4733 Obstructive sleep apnea (adult) (pediatric): Secondary | ICD-10-CM | POA: Diagnosis not present

## 2020-09-19 ENCOUNTER — Other Ambulatory Visit: Payer: Self-pay | Admitting: Pulmonary Disease

## 2020-09-21 ENCOUNTER — Other Ambulatory Visit: Payer: Self-pay | Admitting: Emergency Medicine

## 2020-10-01 DIAGNOSIS — G4733 Obstructive sleep apnea (adult) (pediatric): Secondary | ICD-10-CM | POA: Diagnosis not present

## 2020-10-05 DIAGNOSIS — G4733 Obstructive sleep apnea (adult) (pediatric): Secondary | ICD-10-CM | POA: Diagnosis not present

## 2020-10-07 ENCOUNTER — Encounter: Payer: Self-pay | Admitting: Internal Medicine

## 2020-10-07 ENCOUNTER — Ambulatory Visit: Payer: Self-pay

## 2020-10-07 ENCOUNTER — Other Ambulatory Visit: Payer: Self-pay

## 2020-10-07 ENCOUNTER — Ambulatory Visit: Payer: Medicare HMO | Admitting: Internal Medicine

## 2020-10-07 VITALS — BP 147/81 | HR 76 | Ht 64.0 in | Wt 183.6 lb

## 2020-10-07 DIAGNOSIS — R7689 Other specified abnormal immunological findings in serum: Secondary | ICD-10-CM

## 2020-10-07 DIAGNOSIS — M79642 Pain in left hand: Secondary | ICD-10-CM | POA: Diagnosis not present

## 2020-10-07 DIAGNOSIS — M79641 Pain in right hand: Secondary | ICD-10-CM

## 2020-10-07 DIAGNOSIS — M25462 Effusion, left knee: Secondary | ICD-10-CM | POA: Diagnosis not present

## 2020-10-07 DIAGNOSIS — R768 Other specified abnormal immunological findings in serum: Secondary | ICD-10-CM | POA: Diagnosis not present

## 2020-10-07 DIAGNOSIS — M059 Rheumatoid arthritis with rheumatoid factor, unspecified: Secondary | ICD-10-CM | POA: Insufficient documentation

## 2020-10-07 MED ORDER — LIDOCAINE HCL 1 % IJ SOLN
3.0000 mL | INTRAMUSCULAR | Status: AC | PRN
  Administered 2020-10-07: 3 mL

## 2020-10-07 NOTE — Progress Notes (Signed)
Office Visit Note  Patient: Cynthia Brewer             Date of Birth: [birth year]           MRN: [id number]             PCP: Curlene Labrum, MD Referring: Curlene Labrum, MD Visit Date: 10/07/2020  Subjective:  Other (Patient reports left knee pain and was seen by an orthopedist in [place] who aspirated and injected the knee with steroids. Patient does report a fall a very long time ago on that knee. Patient states she was diagnosed with arthritis in the knee. )   History of Present Illness: Cynthia Brewer is a 70 y.o. female here for inflammation of the left knee with positive ANA and rheumatoid factor. She reports knee pain and swelling started less than 1 year ago but does not recall an exact onset time. She does not recall any preceding events or medication changes associated with this.  For this she had some joint pain and stiffness in her bilateral hands that has been ongoing for about 3 years.  She has not typically noticed swelling in her finger joints describes an occasional appearance of dark or red discoloration MCP joints and distally.  Knee aspiration with steroid injection was performed last month with 15 cc of fluid of fairly bland fluid no significant neutrophil elevation or crystals present.  She noticed a symptom benefit but relatively rapid return of symptoms.  Labs reviewed 08/2020 ANA pos RF 35.7 Uric acid 3.7 ESR 9 CBC wnl  Imaging reviewed 08/19/20 MRI left knee IMPRESSION: Negative for meniscal or ligament tear Mild degenerative disease about the knee Synovium about the knee appears thickened compatible with synovitis  08/09/20 Xray left knee IMPRESSION: Possible joint effusion without significant degenerative change   Activities of Daily Living:  Patient reports morning stiffness for less than 5 minutes.   Patient Reports nocturnal pain.  Difficulty dressing/grooming: Denies Difficulty climbing stairs: Denies Difficulty getting out of chair:  Denies Difficulty using hands for taps, buttons, cutlery, and/or writing: Denies  Review of Systems  Constitutional:  Negative for fatigue.  HENT:  Negative for mouth sores, mouth dryness and nose dryness.   Eyes:  Negative for pain, itching and dryness.  Respiratory:  Negative for shortness of breath and difficulty breathing.   Cardiovascular:  Negative for chest pain and palpitations.  Gastrointestinal:  Negative for blood in stool, constipation and diarrhea.  Endocrine: Negative for increased urination.  Genitourinary:  Negative for difficulty urinating.  Musculoskeletal:  Positive for joint pain, joint pain, joint swelling and morning stiffness. Negative for myalgias, muscle tenderness and myalgias.  Skin:  Negative for color change, rash and redness.  Allergic/Immunologic: Negative for susceptible to infections.  Neurological:  Negative for dizziness, numbness, headaches, memory loss and weakness.  Hematological:  Negative for bruising/bleeding tendency.  Psychiatric/Behavioral:  Positive for depressed mood. Negative for confusion.    PMFS History:  Patient Active Problem List   Diagnosis Date Noted   Rheumatoid factor positive 10/07/2020   Positive ANA (antinuclear antibody) 10/07/2020   Effusion, left knee 10/07/2020   Bilateral hand pain 10/07/2020   IBS (irritable bowel syndrome) 05/23/2020   Hematemesis 05/23/2020   OSA (obstructive sleep apnea) 11/12/2019   Essential hypertension 08/28/2019   Hyperlipidemia 08/28/2019   DOE (dyspnea on exertion) 08/28/2019   Atypical chest pain 08/28/2019   History of parotidectomy 10/27/2018   Latent tuberculosis by skin test 01/03/2017   Chronic  cough 01/03/2017   Asthma-COPD overlap syndrome ([place]) 01/03/2017    Past Medical History:  Diagnosis Date   Anxiety    Arthritis    hands and knees   COPD (chronic obstructive pulmonary disease) (HCC)    Depression    GERD (gastroesophageal reflux disease)    Hypertension    Mass of  right parotid gland     Family History  Problem Relation Age of Onset   Healthy Mother    Healthy Father    Depression Sister    Hypertension Sister    Sleep apnea Sister    Diabetes Sister    Hypertension Sister    Healthy Brother    Throat cancer Brother    Healthy Brother    Autism Brother    Healthy Daughter    Healthy Daughter    Healthy Son    Past Surgical History:  Procedure Laterality Date   ABDOMINAL HYSTERECTOMY     BIOPSY  06/01/2020   Procedure: BIOPSY;  Surgeon: Harvel Quale, MD;  [facility];  Service: Gastroenterology;;   BREAST SURGERY Bilateral    breast bx   CERVICAL SPINE SURGERY     ESOPHAGOGASTRODUODENOSCOPY (EGD) WITH PROPOFOL N/A 06/01/2020   Procedure: ESOPHAGOGASTRODUODENOSCOPY (EGD) WITH PROPOFOL;  Surgeon: Harvel Quale, MD;  [facility];  Service: Gastroenterology;  Laterality: N/A;  AM   HERNIA REPAIR     UHR   PAROTIDECTOMY Right 10/27/2018   Procedure: RIGHT PAROTIDECTOMY;  Surgeon: Leta Baptist, MD;  Location: [place];  Service: ENT;  Laterality: Right;   POLYPECTOMY  06/01/2020   Procedure: POLYPECTOMY;  Surgeon: Harvel Quale, MD;  Location: [facility];  Service: Gastroenterology;;  duodenal nodule;    Social History   Social History Narrative   Not on file   Immunization History  Administered Date(s) Administered   Influenza, High Dose Seasonal PF 01/11/2018   Moderna Sars-Covid-2 Vaccination 05/15/2019, 06/12/2019, 01/25/2020, 07/29/2020   Pneumococcal Polysaccharide-23 01/12/2017     Objective: Vital Signs: BP (!) 147/81 (BP Location: Right Arm, Patient Position: Sitting, Cuff Size: Normal)   Pulse 76   Ht _0  (1.626 m)   Wt 183 lb 9.6 oz (83.3 kg)   BMI 31.51 kg/m    Physical Exam Constitutional:      Appearance: She is obese.  HENT:     Right Ear: External ear normal.     Left Ear: External ear normal.     Mouth/Throat:     Mouth: Mucous  membranes are moist.     Pharynx: Oropharynx is clear.  Eyes:     Conjunctiva/sclera: Conjunctivae normal.  Cardiovascular:     Rate and Rhythm: Normal rate and regular rhythm.  Pulmonary:     Effort: Pulmonary effort is normal.     Breath sounds: Normal breath sounds.  Skin:    General: Skin is warm and dry.     Findings: No rash.  Neurological:     General: No focal deficit present.     Mental Status: She is alert.  Psychiatric:        Mood and Affect: Mood normal.     Musculoskeletal Exam:  Neck full ROM no tenderness Shoulders full ROM no tenderness or swelling Elbows full ROM no tenderness or swelling Wrists full ROM no tenderness or swelling Fingers full ROM no tenderness or swelling Knees full ROM, bilateral patellofemoral crepitus, left knee effusion and warmth full ROM Ankles full ROM no tenderness or  swelling MTPs full ROM no tenderness or swelling   CDAI Exam: CDAI Score: -- Patient Global: --; Provider Global: -- Swollen: 1 ; Tender: 1  Joint Exam 10/07/2020      Right  Left  Knee     Swollen Tender     Investigation: No additional findings.  Imaging: XR Hand 2 View Left  Result Date: 10/07/2020 X-ray left hand 2 views Radiocarpal joint space appears normal.  Mild first CMC joint degenerative arthritis multiple cystic changes in the carpal bones.  MCP joint spaces appear preserved with lateral osteophyte formation.  PIP and DIP joint spaces appear overall intact with lateral osteophytes increased sclerosis and multiple cystic changes seen.  No joint erosions or abnormal bone mineralization are seen. Impression Generalized osteoarthritis throughout hand joints with no erosive changes or demineralization  XR Hand 2 View Right  Result Date: 10/07/2020 X-ray right hand 2 views Radiocarpal joint space appears normal.  Mild degenerative arthritis of the first [facility] joint and multiple cystic changes in carpal bones.  Increased sclerosis and lateral osteophyte  formation and MCP joints with preserved joint spaces.  PIP and DIP joint spaces mild asymmetric narrowing there is increased sclerosis small lateral osteophytes and multiple cystic changes present.  No erosions or areas of demineralization are seen. Impression Osteoarthritis changes throughout the hand no erosive disease evidence or demineralization   Recent Labs: Lab Results  Component Value Date   WBC 10.2 11/12/2019   HGB 14.2 11/12/2019   PLT 247.0 11/12/2019   NA 140 05/31/2020   K 3.9 05/31/2020   CL 104 05/31/2020   CO2 27 05/31/2020   GLUCOSE 102 (H) 05/31/2020   BUN 16 05/31/2020   CREATININE 1.09 (H) 05/31/2020   CALCIUM 8.8 (L) 05/31/2020   GFRAA 57 (L) 10/23/2018    Speciality Comments: No specialty comments available.  Procedures:  Large Joint Inj: L knee on 10/07/2020 10:19 AM Indications: joint swelling and diagnostic evaluation Details: 22 G 1.5 in lateral Medications: 3 mL lidocaine 1 % Aspirate: 13 mL blood-tinged and bloody sent for lab analysis Outcome: tolerated well, no immediate complications   Allergies: Penicillins   Assessment / Plan:     Visit Diagnoses: Effusion, left knee - Plan: Sedimentation rate, Synovial Fluid Analysis, Complete, Anaerobic and Aerobic Culture, Large Joint Inj: L knee  Left knee pain and swelling review of MRI does not show very severe degenerative or traumatic changes to explain the symptoms.  Suspicious for inflammatory process although previous fluid analysis was pretty bland.  The aspiration repeat today clinic for synovial fluid analysis and symptom relief.  Some added discomfort and bloody component to synovial fluid due to anxiety and movement during aspiration.  Rheumatoid factor positive - Plan: Cyclic citrul peptide antibody, IgG  Positive rheumatoid factor only one joint present on exam today not clear of her reported hand symptoms are synovitis.  Also check CCP antibody titer.  Positive ANA (antinuclear antibody) -  Plan: RNP Antibody, Anti-Smith antibody, Sjogrens syndrome-A extractable nuclear antibody, Anti-DNA antibody, double-stranded  Weakly positive ANA we will check specific ENA titers including RNP, Smith, SSA, dsDNA pretest suspicion is low with lack of systemic symptoms.  Bilateral hand pain - Plan: XR Hand 2 View Right, XR Hand 2 View Left, Sedimentation rate  Bilateral hand pain for 3 years no inflammation seen on exam today we will check sedimentation rate and bilateral hand x-rays.  Orders: Orders Placed This Encounter  Procedures   Large Joint Inj: L knee   Anaerobic  and Aerobic Culture   XR Hand 2 View Right   XR Hand 2 View Left   RNP Antibody   Anti-Smith antibody   Sjogrens syndrome-A extractable nuclear antibody   Anti-DNA antibody, double-stranded   Cyclic citrul peptide antibody, IgG   Sedimentation rate   Synovial Fluid Analysis, Complete   No orders of the defined types were placed in this encounter.    Follow-Up Instructions: Return in about 2 weeks (around 10/21/2020) for New pt RA f/u 2 wks.   Collier Salina, MD  Note - This record has been created using [employer].  Chart creation errors have been sought, but may not always  have been located. Such creation errors do not reflect on  the standard of medical care.

## 2020-10-10 LAB — SJOGRENS SYNDROME-A EXTRACTABLE NUCLEAR ANTIBODY: SSA (Ro) (ENA) Antibody, IgG: <1 AI

## 2020-10-10 LAB — CYCLIC CITRUL PEPTIDE ANTIBODY, IGG: Cyclic Citrullin Peptide Ab: <16 UNITS

## 2020-10-10 LAB — RNP ANTIBODY: Ribonucleic Protein(ENA) Antibody, IgG: <1 AI

## 2020-10-10 LAB — SEDIMENTATION RATE: Sed Rate: 2 mm/h (ref 0–30)

## 2020-10-10 LAB — ANTI-DNA ANTIBODY, DOUBLE-STRANDED: ds DNA Ab: <1 IU/mL

## 2020-10-10 LAB — ANTI-SMITH ANTIBODY: ENA SM Ab Ser-aCnc: <1 AI

## 2020-10-12 LAB — ANAEROBIC AND AEROBIC CULTURE
AER RESULT:: NO GROWTH
MICRO NUMBER:: 12152119
MICRO NUMBER:: 12152120
SPECIMEN QUALITY:: ADEQUATE
SPECIMEN QUALITY:: ADEQUATE

## 2020-10-12 LAB — SYNOVIAL FLUID ANALYSIS, COMPLETE
Basophils, %: 0 %
Eosinophils-Synovial: 0 % (ref 0–2)
Lymphocytes-Synovial Fld: 66 % (ref 0–74)
Monocyte/Macrophage: 22 % (ref 0–69)
Neutrophil, Synovial: 11 % (ref 0–24)
Synoviocytes, %: 0 % (ref 0–15)
WBC, Synovial: 1134 cells/uL — ABNORMAL HIGH (ref <150)

## 2020-10-19 NOTE — Progress Notes (Signed)
Office Visit Note  Patient: Cynthia Brewer             Date of Birth: Oct 20, 1950           MRN: [id number]             PCP: Juliette Alcide, MD Referring: Juliette Alcide, MD Visit Date: 10/20/2020   Subjective:  Follow-up (Patient feels as if left knee aspiration/injection offered some relief, but patient complains of continued bilateral knee pain. )   History of Present Illness: Cynthia Brewer is a 70 y.o. female here for follow up for joint pain and swelling especially left knee at initial visit about 2 weeks ago knee aspiration showed fairly low WBC count serology checked was negative for RA Abs, specific ENAs, or high inflammatory markers. Since our last visit she noticed some improvement of the left knee symptoms but only partial. She continues having hand stiffness and discoloration over the MCPs and distally. Knee pain is worst in the posterior aspect of left knee, also keeps constant edema in both legs. She reports past treatment for lymphedema years ago.   Previous HPI: 10/07/20 Cynthia Brewer is a 70 y.o. female here for inflammation of the left knee with positive ANA and rheumatoid factor. She reports knee pain and swelling started less than 1 year ago but does not recall an exact onset time. She does not recall any preceding events or medication changes associated with this.  For this she had some joint pain and stiffness in her bilateral hands that has been ongoing for about 3 years.  She has not typically noticed swelling in her finger joints describes an occasional appearance of dark or red discoloration MCP joints and distally.  Knee aspiration with steroid injection was performed last month with 15 cc of fluid of fairly bland fluid no significant neutrophil elevation or crystals present.  She noticed a symptom benefit but relatively rapid return of symptoms.   Labs reviewed 08/2020 ANA pos RF 35.7 Uric acid 3.7 ESR 9 CBC wnl   Imaging reviewed 08/19/20 MRI left  knee IMPRESSION: Negative for meniscal or ligament tear Mild degenerative disease about the knee Synovium about the knee appears thickened compatible with synovitis   08/09/20 Xray left knee IMPRESSION: Possible joint effusion without significant degenerative change  Review of Systems  Constitutional:  Negative for fatigue.  HENT:  Negative for mouth sores, mouth dryness and nose dryness.   Eyes:  Negative for pain, itching, visual disturbance and dryness.  Respiratory:  Positive for cough. Negative for hemoptysis, shortness of breath and difficulty breathing.   Cardiovascular:  Positive for swelling in legs/feet. Negative for chest pain and palpitations.  Gastrointestinal:  Negative for abdominal pain, blood in stool, constipation and diarrhea.  Endocrine: Negative for increased urination.  Genitourinary:  Negative for painful urination.  Musculoskeletal:  Positive for joint pain, joint pain, joint swelling and morning stiffness. Negative for myalgias, muscle weakness, muscle tenderness and myalgias.  Skin:  Negative for color change, rash and redness.  Allergic/Immunologic: Negative for susceptible to infections.  Neurological:  Negative for dizziness, numbness, headaches, memory loss and weakness.  Hematological:  Negative for swollen glands.  Psychiatric/Behavioral:  Negative for confusion and sleep disturbance.    PMFS History:  Patient Active Problem List   Diagnosis Date Noted   High risk medication use 10/20/2020   Seropositive rheumatoid arthritis (HCC) 10/07/2020   Positive ANA (antinuclear antibody) 10/07/2020   Effusion, left knee 10/07/2020   Bilateral  hand pain 10/07/2020   IBS (irritable bowel syndrome) 05/23/2020   Hematemesis 05/23/2020   OSA (obstructive sleep apnea) 11/12/2019   Essential hypertension 08/28/2019   Hyperlipidemia 08/28/2019   DOE (dyspnea on exertion) 08/28/2019   Atypical chest pain 08/28/2019   History of parotidectomy 10/27/2018   Rectal  bleeding 08/21/2017   History of colon polyps 05/09/2017   Latent tuberculosis by skin test 01/03/2017   Chronic cough 01/03/2017   Asthma-COPD overlap syndrome ([place]) 01/03/2017    Past Medical History:  Diagnosis Date   Anxiety    Arthritis    hands and knees   COPD (chronic obstructive pulmonary disease) ([place])    Depression    GERD (gastroesophageal reflux disease)    Hypertension    Mass of right parotid gland     Family History  Problem Relation Age of Onset   Healthy Mother    Healthy Father    Depression Sister    Hypertension Sister    Sleep apnea Sister    Diabetes Sister    Hypertension Sister    Healthy Brother    Throat cancer Brother    Healthy Brother    Autism Brother    Healthy Daughter    Healthy Daughter    Healthy Son    Past Surgical History:  Procedure Laterality Date   ABDOMINAL HYSTERECTOMY     BIOPSY  06/01/2020   Procedure: BIOPSY;  Surgeon: Harvel Quale, MD;  [facility];  Service: Gastroenterology;;   BREAST SURGERY Bilateral    breast bx   CERVICAL SPINE SURGERY     ESOPHAGOGASTRODUODENOSCOPY (EGD) WITH PROPOFOL N/A 06/01/2020   Procedure: ESOPHAGOGASTRODUODENOSCOPY (EGD) WITH PROPOFOL;  Surgeon: Harvel Quale, MD;  [facility]: [facility];  Service: Gastroenterology;  Laterality: N/A;  AM   HERNIA REPAIR     UHR   PAROTIDECTOMY Right 10/27/2018   Procedure: RIGHT PAROTIDECTOMY;  Surgeon: Leta Baptist, MD;  Location: [facility];  Service: ENT;  Laterality: Right;   POLYPECTOMY  06/01/2020   Procedure: POLYPECTOMY;  Surgeon: Harvel Quale, MD;  Location: [facility];  Service: Gastroenterology;;  duodenal nodule;    Social History   Social History Narrative   Not on file   Immunization History  Administered Date(s) Administered   Influenza, High Dose Seasonal PF 01/11/2018   Moderna Sars-Covid-2 Vaccination 05/15/2019, 06/12/2019, 01/25/2020, 07/29/2020   Pneumococcal  Polysaccharide-23 01/12/2017     Objective: Vital Signs: BP (!) 149/77 (BP Location: Right Arm, Patient Position: Sitting, Cuff Size: Large)   Pulse 80   Ht $R'5\' 5"'ga$  (1.651 m)   Wt 183 lb 12.8 oz (83.4 kg)   BMI 30.59 kg/m    Physical Exam Skin:    General: Skin is warm and dry.     Comments: Pitting edema in bilateral legs distal to knees, without any stasis dermatitis changes or ulcers  Neurological:     Mental Status: She is alert.  Psychiatric:        Mood and Affect: Mood normal.     Musculoskeletal Exam:  Shoulders full ROM no tenderness or swelling Elbows full ROM no tenderness or swelling Wrists full ROM no tenderness or swelling Fingers full ROM no tenderness or swelling Knees slightly decreased ROM, tenderness and swelling in posterior left knee, right knee tenderness without palpable swelling, mild crepitus b/l Ankles full ROM no tenderness or swelling   CDAI Exam: CDAI Score: -- Patient Global: --; Provider Global: -- Swollen: 1 ; Tender:  2  Joint Exam 10/20/2020      Right  Left  Knee   Tender  Swollen Tender     Investigation: No additional findings.  Imaging: XR Hand 2 View Left  Result Date: 10/07/2020 X-ray left hand 2 views Radiocarpal joint space appears normal.  Mild first CMC joint degenerative arthritis multiple cystic changes in the carpal bones.  MCP joint spaces appear preserved with lateral osteophyte formation.  PIP and DIP joint spaces appear overall intact with lateral osteophytes increased sclerosis and multiple cystic changes seen.  No joint erosions or abnormal bone mineralization are seen. Impression Generalized osteoarthritis throughout hand joints with no erosive changes or demineralization  XR Hand 2 View Right  Result Date: 10/07/2020 X-ray right hand 2 views Radiocarpal joint space appears normal.  Mild degenerative arthritis of the first [facility] joint and multiple cystic changes in carpal bones.  Increased sclerosis and lateral  osteophyte formation and MCP joints with preserved joint spaces.  PIP and DIP joint spaces mild asymmetric narrowing there is increased sclerosis small lateral osteophytes and multiple cystic changes present.  No erosions or areas of demineralization are seen. Impression Osteoarthritis changes throughout the hand no erosive disease evidence or demineralization   Recent Labs: Lab Results  Component Value Date   WBC 10.2 11/12/2019   HGB 14.2 11/12/2019   PLT 247.0 11/12/2019   NA 140 05/31/2020   K 3.9 05/31/2020   CL 104 05/31/2020   CO2 27 05/31/2020   GLUCOSE 102 (H) 05/31/2020   BUN 16 05/31/2020   CREATININE 1.09 (H) 05/31/2020   CALCIUM 8.8 (L) 05/31/2020   GFRAA 57 (L) 10/23/2018    Speciality Comments: No specialty comments available.  Procedures:  No procedures performed Allergies: Penicillins   Assessment / Plan:     Visit Diagnoses: Rheumatoid factor positive Seropositive RA of multiple sites  Symptoms are probably at least in part inflammatory activity with overlying skin discoloration and swelling and positive serology, with no substantial structural change on previous MRI. No erosions on xrays in hands or knees. Recommend starting methotrexate PO as DMARD treatment for this after baseline labs checked and f/u in 1 month.  Latent tuberculosis by skin test  Previous history of latent TB, with past positive skin test and calcified granuloma on chest xray. She reports history of past antibiotic treatment for this in [place]. Checking quantiferon test today before starting treatment in case future DMARD options are needed.  High risk medication use - Plan: CBC with Differential/Platelet, COMPLETE METABOLIC PANEL WITH GFR, Hepatitis B core antibody, IgM, Hepatitis B surface antigen, Hepatitis C antibody, QuantiFERON-TB Gold Plus  Discussed risks of methotrexate including hepatotoxicity, GI intolerance, rashes or ulcers, cytopenia. Checking baseline CBC, CMP, hepatitis  serology, and quantiferon. Previous eGFR slightly decreased but normal LFTs. Previous chest CT reviewed showing stable chronic granuloma, old mild scarring, emphysema no evidence of ILD or nodulosis.   Orders: Orders Placed This Encounter  Procedures   CBC with Differential/Platelet   COMPLETE METABOLIC PANEL WITH GFR   Hepatitis B core antibody, IgM   Hepatitis B surface antigen   Hepatitis C antibody   QuantiFERON-TB Gold Plus    No orders of the defined types were placed in this encounter.    Follow-Up Instructions: Return in about 4 weeks (around 11/17/2020) for RA MTX start f/u 4wks.   Collier Salina, MD  Note - This record has been created using [employer].  Chart creation errors have been sought, but may not always  have been located. Such creation errors do not reflect on  the standard of medical care.

## 2020-10-20 ENCOUNTER — Other Ambulatory Visit: Payer: Self-pay

## 2020-10-20 ENCOUNTER — Encounter: Payer: Self-pay | Admitting: Internal Medicine

## 2020-10-20 ENCOUNTER — Ambulatory Visit: Payer: Medicare HMO | Admitting: Internal Medicine

## 2020-10-20 VITALS — BP 149/77 | HR 80 | Ht 65.0 in | Wt 183.8 lb

## 2020-10-20 DIAGNOSIS — R768 Other specified abnormal immunological findings in serum: Secondary | ICD-10-CM

## 2020-10-20 DIAGNOSIS — Z227 Latent tuberculosis: Secondary | ICD-10-CM | POA: Diagnosis not present

## 2020-10-20 DIAGNOSIS — Z79899 Other long term (current) drug therapy: Secondary | ICD-10-CM

## 2020-10-23 LAB — COMPLETE METABOLIC PANEL WITH GFR
AG Ratio: 1.3 (calc) (ref 1.0–2.5)
ALT: 14 U/L (ref 6–29)
AST: 16 U/L (ref 10–35)
Albumin: 4 g/dL (ref 3.6–5.1)
Alkaline phosphatase (APISO): 64 U/L (ref 37–153)
BUN/Creatinine Ratio: 12 (calc) (ref 6–22)
BUN: 13 mg/dL (ref 7–25)
CO2: 29 mmol/L (ref 20–32)
Calcium: 9.1 mg/dL (ref 8.6–10.4)
Chloride: 103 mmol/L (ref 98–110)
Creat: 1.13 mg/dL — ABNORMAL HIGH (ref 0.60–1.00)
Globulin: 3.1 g/dL (calc) (ref 1.9–3.7)
Glucose, Bld: 105 mg/dL — ABNORMAL HIGH (ref 65–99)
Potassium: 4.4 mmol/L (ref 3.5–5.3)
Sodium: 138 mmol/L (ref 135–146)
Total Bilirubin: 0.6 mg/dL (ref 0.2–1.2)
Total Protein: 7.1 g/dL (ref 6.1–8.1)
eGFR: 52 mL/min/{1.73_m2} — ABNORMAL LOW (ref >=60)

## 2020-10-23 LAB — CBC WITH DIFFERENTIAL/PLATELET
Absolute Monocytes: 391 cells/uL (ref 200–950)
Basophils Absolute: 37 cells/uL (ref 0–200)
Basophils Relative: 0.6 %
Eosinophils Absolute: 248 cells/uL (ref 15–500)
Eosinophils Relative: 4 %
HCT: 44.9 % (ref 35.0–45.0)
Hemoglobin: 14.8 g/dL (ref 11.7–15.5)
Lymphs Abs: 2809 cells/uL (ref 850–3900)
MCH: 30 pg (ref 27.0–33.0)
MCHC: 33 g/dL (ref 32.0–36.0)
MCV: 90.9 fL (ref 80.0–100.0)
MPV: 9.3 fL (ref 7.5–12.5)
Monocytes Relative: 6.3 %
Neutro Abs: 2716 cells/uL (ref 1500–7800)
Neutrophils Relative %: 43.8 %
Platelets: 237 10*3/uL (ref 140–400)
RBC: 4.94 10*6/uL (ref 3.80–5.10)
RDW: 12.3 % (ref 11.0–15.0)
Total Lymphocyte: 45.3 %
WBC: 6.2 10*3/uL (ref 3.8–10.8)

## 2020-10-23 LAB — HEPATITIS C ANTIBODY
Hepatitis C Ab: NONREACTIVE
SIGNAL TO CUT-OFF: 0.02 (ref <1.00)

## 2020-10-23 LAB — QUANTIFERON-TB GOLD PLUS
Mitogen-NIL: >10 IU/mL
NIL: 0.1 IU/mL
QuantiFERON-TB Gold Plus: NEGATIVE
TB1-NIL: 0.03 IU/mL
TB2-NIL: 0.1 IU/mL

## 2020-10-23 LAB — HEPATITIS B CORE ANTIBODY, IGM: Hep B C IgM: NONREACTIVE

## 2020-10-23 LAB — HEPATITIS B SURFACE ANTIGEN: Hepatitis B Surface Ag: NONREACTIVE

## 2020-10-24 ENCOUNTER — Other Ambulatory Visit: Payer: Self-pay | Admitting: Internal Medicine

## 2020-10-24 ENCOUNTER — Telehealth: Payer: Self-pay | Admitting: Internal Medicine

## 2020-10-24 DIAGNOSIS — R768 Other specified abnormal immunological findings in serum: Secondary | ICD-10-CM

## 2020-10-24 MED ORDER — FOLIC ACID 1 MG PO TABS: 1.0000 mg | ORAL_TABLET | Freq: Every day | ORAL | 0 refills | Status: DC

## 2020-10-24 MED ORDER — METHOTREXATE 2.5 MG PO TABS
15.0000 mg | ORAL_TABLET | ORAL | 0 refills | Status: DC
Start: 2020-10-24 — End: 2020-11-28

## 2020-10-24 NOTE — Addendum Note (Signed)
Addended by: Collier Salina on: 10/24/2020 04:23 PM   Modules accepted: Orders

## 2020-10-24 NOTE — Telephone Encounter (Signed)
Patient calling to see if her lab results have come back yet? Please call to advise.

## 2020-10-24 NOTE — Telephone Encounter (Signed)
Lab results look okay for starting methotrexate. Her kidney function test is just slightly below normal. If she is okay starting the medication will send Rx to pharmacy - methotrexate 6 tablets once weekly and folic acid 1 mg daily.

## 2020-10-24 NOTE — Telephone Encounter (Signed)
Spoke with patient, advised lab results look okay for starting methotrexate. Her kidney function test is just slightly below normal. If she is okay starting the medication will send Rx to pharmacy - methotrexate 6 tablets once weekly and folic acid 1 mg daily.   Patient is in agreement to start medication- advised Rxs have been sent to [facility] in [place], [place].

## 2020-10-26 ENCOUNTER — Telehealth: Payer: Self-pay | Admitting: Internal Medicine

## 2020-10-26 NOTE — Telephone Encounter (Signed)
Spoke with patient, advised, per Rx, to take MTX 6 tablets weekly, all 6 pills at once.

## 2020-10-26 NOTE — Telephone Encounter (Signed)
Patient calling in reference to MTX. Patient is confused about instructions on how to take medication.  Please call to advise.

## 2020-11-01 DIAGNOSIS — G4733 Obstructive sleep apnea (adult) (pediatric): Secondary | ICD-10-CM | POA: Diagnosis not present

## 2020-11-07 ENCOUNTER — Telehealth: Payer: Self-pay

## 2020-11-07 NOTE — Telephone Encounter (Signed)
Will need to move follow up appointment back, needs to be at least 2-3 weeks after getting back onto the medicine continuously. I spoke with Cynthia Brewer her symptoms sounds like some kind of pharyngitis with throat soreness and swelling. This may be a viral URI the symptoms are atypical for methotrexate drug reaction. Recommend she can interrupt the medicine for now though and wait for symptoms to improve. Afterwards can try restarting and if there is recurrence will have to change the treatment plan.

## 2020-11-07 NOTE — Telephone Encounter (Signed)
Patient called stating her neck is swollen on the right side, stiff, and painful with coughing.  Patient states she has been experiencing these symptoms for the past 4 days and is not sure if it is due to her medications (Methotrexate and Folic Acid).  Patient requested a return call.

## 2020-11-10 ENCOUNTER — Telehealth: Payer: Self-pay | Admitting: *Deleted

## 2020-11-10 NOTE — Telephone Encounter (Signed)
Spoke with patient- her symptoms have improved, advised per last telephone encounter okay to restart since symptoms have improved. Patient expressed understanding.

## 2020-11-10 NOTE — Telephone Encounter (Signed)
Patient states she was advised to hold her MTX and Folic Acid. Patient would like to know if she can restart the medication. Patient request a call back to advise.

## 2020-11-14 ENCOUNTER — Telehealth: Payer: Self-pay

## 2020-11-14 DIAGNOSIS — M069 Rheumatoid arthritis, unspecified: Secondary | ICD-10-CM | POA: Diagnosis not present

## 2020-11-14 DIAGNOSIS — G4733 Obstructive sleep apnea (adult) (pediatric): Secondary | ICD-10-CM | POA: Diagnosis not present

## 2020-11-14 DIAGNOSIS — I890 Lymphedema, not elsewhere classified: Secondary | ICD-10-CM

## 2020-11-14 DIAGNOSIS — D84821 Immunodeficiency due to drugs: Secondary | ICD-10-CM | POA: Diagnosis not present

## 2020-11-14 DIAGNOSIS — I739 Peripheral vascular disease, unspecified: Secondary | ICD-10-CM | POA: Diagnosis not present

## 2020-11-14 DIAGNOSIS — E785 Hyperlipidemia, unspecified: Secondary | ICD-10-CM | POA: Diagnosis not present

## 2020-11-14 DIAGNOSIS — G8929 Other chronic pain: Secondary | ICD-10-CM | POA: Diagnosis not present

## 2020-11-14 DIAGNOSIS — R69 Illness, unspecified: Secondary | ICD-10-CM | POA: Diagnosis not present

## 2020-11-14 DIAGNOSIS — E669 Obesity, unspecified: Secondary | ICD-10-CM | POA: Diagnosis not present

## 2020-11-14 NOTE — Telephone Encounter (Signed)
Patient called stating she had her nurse assessment at her home today and was told she needed to contact Dr. Benjamine Mola regarding the swelling in her legs and ankles.  The nurse diagnosed her with Lymphedema.  Patient requested a return call.

## 2020-11-14 NOTE — Telephone Encounter (Signed)
FYI- I spoke with Cynthia Brewer about her lymphedema she may benefit with seeing a clinic for management of this such as compression, devices, or therapy. She requests a site closer to her home in [place] if possible will recommend [facility] at [place].

## 2020-11-23 ENCOUNTER — Ambulatory Visit: Payer: Medicare HMO | Admitting: Internal Medicine

## 2020-11-25 ENCOUNTER — Other Ambulatory Visit: Payer: Self-pay | Admitting: Internal Medicine

## 2020-11-25 DIAGNOSIS — R768 Other specified abnormal immunological findings in serum: Secondary | ICD-10-CM

## 2020-11-25 NOTE — Telephone Encounter (Signed)
Patient finished MTX awhile ago, and has continued with Folic Acid. Patient knew her hair could fall out, but she didn't know it would come out as much as it had. Patient ran out of MTX last week some time, and because there was no refills on bottle she just stopped medication. Patient very confused. Please call to advise.

## 2020-11-28 ENCOUNTER — Other Ambulatory Visit: Payer: Self-pay | Admitting: Internal Medicine

## 2020-11-28 DIAGNOSIS — R768 Other specified abnormal immunological findings in serum: Secondary | ICD-10-CM

## 2020-11-28 MED ORDER — METHOTREXATE 2.5 MG PO TABS: 15.0000 mg | ORAL_TABLET | ORAL | 0 refills | Status: DC

## 2020-11-28 NOTE — Telephone Encounter (Signed)
Yes, methotrexate was held temporarily for what sounds like viral illness symptoms that subsequently improved.

## 2020-11-28 NOTE — Telephone Encounter (Signed)
Appears patient held MTX for a short period then resumed and should be taking?  Next Visit: 12/07/2020 Last Visit: 10/20/2020  Last Fill: 10/24/2020  DX: Rheumatoid factor positive  Current Dose per office note 10/24/2020: Take 6 tablets (15 mg total) by mouth once a week.   Labs: 10/20/2020 CBC, CMP  Okay to refill MTX?

## 2020-11-28 NOTE — Telephone Encounter (Signed)
Spoke with patient, advised refill for MTX was sent to pharmacy.

## 2020-12-01 ENCOUNTER — Other Ambulatory Visit: Payer: Self-pay | Admitting: Emergency Medicine

## 2020-12-02 DIAGNOSIS — G4733 Obstructive sleep apnea (adult) (pediatric): Secondary | ICD-10-CM | POA: Diagnosis not present

## 2020-12-07 ENCOUNTER — Ambulatory Visit: Payer: Medicare HMO | Admitting: Internal Medicine

## 2020-12-07 ENCOUNTER — Other Ambulatory Visit: Payer: Self-pay

## 2020-12-07 ENCOUNTER — Encounter: Payer: Self-pay | Admitting: Internal Medicine

## 2020-12-07 ENCOUNTER — Telehealth: Payer: Self-pay | Admitting: Internal Medicine

## 2020-12-07 VITALS — BP 148/74 | HR 76 | Ht 64.0 in | Wt 182.6 lb

## 2020-12-07 DIAGNOSIS — Z79899 Other long term (current) drug therapy: Secondary | ICD-10-CM | POA: Diagnosis not present

## 2020-12-07 DIAGNOSIS — M79641 Pain in right hand: Secondary | ICD-10-CM | POA: Diagnosis not present

## 2020-12-07 DIAGNOSIS — M25462 Effusion, left knee: Secondary | ICD-10-CM | POA: Diagnosis not present

## 2020-12-07 DIAGNOSIS — R768 Other specified abnormal immunological findings in serum: Secondary | ICD-10-CM

## 2020-12-07 DIAGNOSIS — M79642 Pain in left hand: Secondary | ICD-10-CM

## 2020-12-07 DIAGNOSIS — M059 Rheumatoid arthritis with rheumatoid factor, unspecified: Secondary | ICD-10-CM | POA: Diagnosis not present

## 2020-12-07 MED ORDER — METHOTREXATE 2.5 MG PO TABS: 15.0000 mg | ORAL_TABLET | ORAL | 0 refills | Status: DC

## 2020-12-07 NOTE — Telephone Encounter (Signed)
FYI: Patient is switching pharmacy's to [facility] in [place]. Please change this on patient's chart.

## 2020-12-07 NOTE — Progress Notes (Signed)
Office Visit Note  Patient: Cynthia Brewer             Date of Birth: 1950-07-10           MRN: [id number]             PCP: Curlene Labrum, MD Referring: Curlene Labrum, MD Visit Date: 12/07/2020   Subjective:  Follow-up (Patient complains of continued left knee pain. Patient complains of bilateral ankle swelling. Patient is currently taking MTX 15 mg weekly, she interrupted temporarily due to URI.)   History of Present Illness: Cynthia Brewer is a 70 y.o. female here for follow up for seropositive RA after starting methotrexate 15 mg PO weekly. Her start was slightly delayed due to probably viral URI symptoms. She was also referred for lymphedema therapy at recommendation of nursing evaluation.  Left knee pain continues to be the biggest problem for her.  Bilateral hand swelling and stiffness also ongoing for 30 minutes or less per day.  She has not yet seen [facility] for starting further lymphedema treatment sounds like discussed some type of lower extremity compression.  Previous HPI: 10/20/20 [name] is a 70 y.o. female here for follow up for joint pain and swelling especially left knee at initial visit about 2 weeks ago knee aspiration showed fairly low WBC count serology checked was negative for RA Abs, specific ENAs, or high inflammatory markers. Since our last visit she noticed some improvement of the left knee symptoms but only partial. She continues having hand stiffness and discoloration over the MCPs and distally. Knee pain is worst in the posterior aspect of left knee, also keeps constant edema in both legs. She reports past treatment for lymphedema years ago.     10/07/20 [name] is a 70 y.o. female here for inflammation of the left knee with positive ANA and rheumatoid factor. She reports knee pain and swelling started less than 1 year ago but does not recall an exact onset time. She does not recall any preceding events or medication changes  associated with this.  For this she had some joint pain and stiffness in her bilateral hands that has been ongoing for about 3 years.  She has not typically noticed swelling in her finger joints describes an occasional appearance of dark or red discoloration MCP joints and distally.  Knee aspiration with steroid injection was performed last month with 15 cc of fluid of fairly bland fluid no significant neutrophil elevation or crystals present.  She noticed a symptom benefit but relatively rapid return of symptoms.   Labs reviewed 08/2020 ANA pos RF 35.7 Uric acid 3.7 ESR 9 CBC wnl   Imaging reviewed 08/19/20 MRI left knee IMPRESSION: Negative for meniscal or ligament tear Mild degenerative disease about the knee Synovium about the knee appears thickened compatible with synovitis   08/09/20 Xray left knee IMPRESSION: Possible joint effusion without significant degenerative change  Review of Systems  Constitutional:  Negative for fatigue.  HENT:  Negative for mouth sores, mouth dryness and nose dryness.   Eyes:  Negative for pain, itching, visual disturbance and dryness.  Respiratory:  Positive for shortness of breath. Negative for cough, hemoptysis and difficulty breathing.   Cardiovascular:  Positive for swelling in legs/feet. Negative for chest pain and palpitations.  Gastrointestinal:  Negative for abdominal pain, blood in stool, constipation and diarrhea.  Endocrine: Negative for increased urination.  Genitourinary:  Negative for painful urination.  Musculoskeletal:  Positive for joint  pain, joint pain, joint swelling and morning stiffness. Negative for myalgias, muscle weakness, muscle tenderness and myalgias.  Skin:  Negative for color change, rash and redness.  Allergic/Immunologic: Negative for susceptible to infections.  Neurological:  Negative for dizziness, numbness, headaches, memory loss and weakness.  Hematological:  Negative for swollen glands.  Psychiatric/Behavioral:   Negative for confusion and sleep disturbance.    PMFS History:  Patient Active Problem List   Diagnosis Date Noted   High risk medication use 10/20/2020   Seropositive rheumatoid arthritis ([place]) 10/07/2020   Positive ANA (antinuclear antibody) 10/07/2020   Effusion, left knee 10/07/2020   Bilateral hand pain 10/07/2020   IBS (irritable bowel syndrome) 05/23/2020   Hematemesis 05/23/2020   OSA (obstructive sleep apnea) 11/12/2019   Essential hypertension 08/28/2019   Hyperlipidemia 08/28/2019   DOE (dyspnea on exertion) 08/28/2019   Atypical chest pain 08/28/2019   History of parotidectomy 10/27/2018   Rectal bleeding 08/21/2017   History of colon polyps 05/09/2017   Latent tuberculosis by skin test 01/03/2017   Chronic cough 01/03/2017   Asthma-COPD overlap syndrome ([place]) 01/03/2017    Past Medical History:  Diagnosis Date   Anxiety    Arthritis    hands and knees   COPD (chronic obstructive pulmonary disease) ([place])    Depression    GERD (gastroesophageal reflux disease)    Hypertension    Mass of right parotid gland     Family History  Problem Relation Age of Onset   Healthy Mother    Healthy Father    Depression Sister    Hypertension Sister    Sleep apnea Sister    Diabetes Sister    Hypertension Sister    Healthy Brother    Throat cancer Brother    Healthy Brother    Autism Brother    Healthy Daughter    Healthy Daughter    Healthy Son    Past Surgical History:  Procedure Laterality Date   ABDOMINAL HYSTERECTOMY     BIOPSY  06/01/2020   Procedure: BIOPSY;  Surgeon: Harvel Quale, MD;  [facility];  Service: Gastroenterology;;   BREAST SURGERY Bilateral    breast bx   CERVICAL SPINE SURGERY     ESOPHAGOGASTRODUODENOSCOPY (EGD) WITH PROPOFOL N/A 06/01/2020   Procedure: ESOPHAGOGASTRODUODENOSCOPY (EGD) WITH PROPOFOL;  Surgeon: Harvel Quale, MD;  [facility];  Service: Gastroenterology;  Laterality: N/A;  AM    HERNIA REPAIR     UHR   PAROTIDECTOMY Right 10/27/2018   Procedure: RIGHT PAROTIDECTOMY;  Surgeon: Leta Baptist, MD;  Location: [facility];  Service: ENT;  Laterality: Right;   POLYPECTOMY  06/01/2020   Procedure: POLYPECTOMY;  Surgeon: Harvel Quale, MD;  [facility];  Service: Gastroenterology;;  duodenal nodule;    Social History   Social History Narrative   Not on file   Immunization History  Administered Date(s) Administered   Influenza, High Dose Seasonal PF 01/11/2018   Moderna Sars-Covid-2 Vaccination 05/15/2019, 06/12/2019, 01/25/2020, 07/29/2020   Pneumococcal Polysaccharide-23 01/12/2017     Objective: Vital Signs: BP (!) 148/74 (BP Location: Left Arm, Patient Position: Sitting, Cuff Size: Normal)   Pulse 76   Ht _0  (1.626 m)   Wt 182 lb 9.6 oz (82.8 kg)   BMI 31.34 kg/m    Physical Exam Cardiovascular:     Rate and Rhythm: Normal rate and regular rhythm.  Pulmonary:     Effort: Pulmonary effort is normal.  Breath sounds: Normal breath sounds.  Musculoskeletal:     Right lower leg: Edema present.     Left lower leg: Edema present.     Comments: Bilateral lower extremity 1+ pitting edema halfway down the shins and ankles no tortuous superficial veins or stasis dermatitis changes  Skin:    Findings: No rash.  Psychiatric:        Mood and Affect: Mood normal.     Musculoskeletal Exam:  Elbows full ROM no tenderness or swelling Wrists full ROM no tenderness or swelling Fingers full ROM no tenderness or swelling Left knee slightly decreased range of motion with tenderness and swelling   CDAI Exam: CDAI Score: -- Patient Global: --; Provider Global: -- Swollen: 1 ; Tender: 1  Joint Exam 12/07/2020      Right  Left  Knee     Swollen Tender       Investigation: No additional findings.  Imaging: No results found.  Recent Labs: Lab Results  Component Value Date   WBC 6.0 12/07/2020   HGB 14.7  12/07/2020   PLT 260 12/07/2020   NA 138 12/07/2020   K 4.3 12/07/2020   CL 103 12/07/2020   CO2 29 12/07/2020   GLUCOSE 97 12/07/2020   BUN 18 12/07/2020   CREATININE 1.11 (H) 12/07/2020   BILITOT 0.7 12/07/2020   AST 15 12/07/2020   ALT 17 12/07/2020   PROT 7.1 12/07/2020   CALCIUM 9.0 12/07/2020   GFRAA 57 (L) 10/23/2018   QFTBGOLDPLUS NEGATIVE 10/20/2020    Speciality Comments: No specialty comments available.  Procedures:  No procedures performed Allergies: Penicillins   Assessment / Plan:     Visit Diagnoses: Seropositive rheumatoid arthritis ([place])  Left knee effusion - Plan: C-reactive protein, methotrexate (RHEUMATREX) 2.5 MG tablet  No improvement so far but has only recently started the methotrexate was delayed due to interval infection.  We will repeat CRP for disease activity monitoring.  Continue methotrexate 15 mg p.o. weekly and folic acid 1 mg daily.  Plan to reassess for response in another 2 months.  If there is no clinical improvement the degree of active inflammation is not very clear with some previous normal inflammatory markers, low synovial fluid WBC count, limited involvement in site could also be explained by osteoarthritis and lymphedema.  High risk medication use - Plan: CBC with Differential/Platelet, COMPLETE METABOLIC PANEL WITH GFR  New start methotrexate needs monitoring we will check CBC and CMP today.  Bilateral hand pain  No inflammatory changes seen on exam or current degree of stiffness could be consistent with osteoarthritis.   Orders: Orders Placed This Encounter  Procedures   C-reactive protein   CBC with Differential/Platelet   COMPLETE METABOLIC PANEL WITH GFR    Meds ordered this encounter  Medications   methotrexate (RHEUMATREX) 2.5 MG tablet    Sig: Take 6 tablets (15 mg total) by mouth once a week. Caution:Chemotherapy. Protect from light.    Dispense:  30 tablet    Refill:  0      Follow-Up Instructions: Return  in about 2 months (around 02/06/2021).   Collier Salina, MD  Note - This record has been created using [employer].  Chart creation errors have been sought, but may not always  have been located. Such creation errors do not reflect on  the standard of medical care.

## 2020-12-07 NOTE — Telephone Encounter (Signed)
[facility] [place], [place].

## 2020-12-08 LAB — CBC WITH DIFFERENTIAL/PLATELET
Absolute Monocytes: 402 cells/uL (ref 200–950)
Basophils Absolute: 48 cells/uL (ref 0–200)
Basophils Relative: 0.8 %
Eosinophils Absolute: 162 cells/uL (ref 15–500)
Eosinophils Relative: 2.7 %
HCT: 44.4 % (ref 35.0–45.0)
Hemoglobin: 14.7 g/dL (ref 11.7–15.5)
Lymphs Abs: 2874 cells/uL (ref 850–3900)
MCH: 30.9 pg (ref 27.0–33.0)
MCHC: 33.1 g/dL (ref 32.0–36.0)
MCV: 93.3 fL (ref 80.0–100.0)
MPV: 9.1 fL (ref 7.5–12.5)
Monocytes Relative: 6.7 %
Neutro Abs: 2514 cells/uL (ref 1500–7800)
Neutrophils Relative %: 41.9 %
Platelets: 260 10*3/uL (ref 140–400)
RBC: 4.76 10*6/uL (ref 3.80–5.10)
RDW: 13.1 % (ref 11.0–15.0)
Total Lymphocyte: 47.9 %
WBC: 6 10*3/uL (ref 3.8–10.8)

## 2020-12-08 LAB — COMPLETE METABOLIC PANEL WITH GFR
AG Ratio: 1.4 (calc) (ref 1.0–2.5)
ALT: 17 U/L (ref 6–29)
AST: 15 U/L (ref 10–35)
Albumin: 4.1 g/dL (ref 3.6–5.1)
Alkaline phosphatase (APISO): 70 U/L (ref 37–153)
BUN/Creatinine Ratio: 16 (calc) (ref 6–22)
BUN: 18 mg/dL (ref 7–25)
CO2: 29 mmol/L (ref 20–32)
Calcium: 9 mg/dL (ref 8.6–10.4)
Chloride: 103 mmol/L (ref 98–110)
Creat: 1.11 mg/dL — ABNORMAL HIGH (ref 0.60–1.00)
Globulin: 3 g/dL (calc) (ref 1.9–3.7)
Glucose, Bld: 97 mg/dL (ref 65–99)
Potassium: 4.3 mmol/L (ref 3.5–5.3)
Sodium: 138 mmol/L (ref 135–146)
Total Bilirubin: 0.7 mg/dL (ref 0.2–1.2)
Total Protein: 7.1 g/dL (ref 6.1–8.1)
eGFR: 53 mL/min/{1.73_m2} — ABNORMAL LOW (ref >=60)

## 2020-12-08 LAB — C-REACTIVE PROTEIN: CRP: 8.6 mg/L — ABNORMAL HIGH (ref <8.0)

## 2020-12-12 NOTE — Progress Notes (Signed)
Labs look fine to continue methotrexate as planned. CRP is slightly elevated, so low amount of inflammation may be ongoing.

## 2020-12-14 DIAGNOSIS — Z6830 Body mass index (BMI) 30.0-30.9, adult: Secondary | ICD-10-CM | POA: Diagnosis not present

## 2020-12-14 DIAGNOSIS — N898 Other specified noninflammatory disorders of vagina: Secondary | ICD-10-CM | POA: Diagnosis not present

## 2020-12-22 ENCOUNTER — Telehealth: Payer: Self-pay

## 2020-12-22 NOTE — Telephone Encounter (Signed)
Patient called requesting prescription refill of Methotrexate to be [facility] to [facility] [place] [facility] [place].  Patient states she is out of medication.

## 2020-12-22 NOTE — Telephone Encounter (Signed)
Patient advised prescription was sent to the pharmacy on 12/07/2020. Patient advised prescription was sent to [facility]. Patient [facility] and [facility]. Patient expressed understanding.

## 2020-12-26 ENCOUNTER — Ambulatory Visit (HOSPITAL_COMMUNITY): Payer: Medicare HMO | Attending: Internal Medicine | Admitting: Physical Therapy

## 2020-12-28 ENCOUNTER — Encounter (HOSPITAL_COMMUNITY): Payer: Medicare HMO | Admitting: Physical Therapy

## 2020-12-30 ENCOUNTER — Encounter (HOSPITAL_COMMUNITY): Payer: Medicare HMO | Admitting: Physical Therapy

## 2020-12-30 ENCOUNTER — Telehealth (HOSPITAL_COMMUNITY): Payer: Self-pay | Admitting: Physical Therapy

## 2020-12-30 NOTE — Telephone Encounter (Signed)
Called pt as she did not show for her evaluation.  PT states that she started wearing compression garments and this has taken care of her swelling.  She requested all furture visits to be cancelled.   Rayetta Humphrey, [place] CLT [phone]

## 2021-01-01 DIAGNOSIS — G4733 Obstructive sleep apnea (adult) (pediatric): Secondary | ICD-10-CM | POA: Diagnosis not present

## 2021-01-02 ENCOUNTER — Other Ambulatory Visit: Payer: Self-pay | Admitting: Internal Medicine

## 2021-01-02 DIAGNOSIS — M059 Rheumatoid arthritis with rheumatoid factor, unspecified: Secondary | ICD-10-CM

## 2021-01-02 NOTE — Telephone Encounter (Signed)
Next Visit: 02/08/2021  Last Visit: 12/07/2020  Last Fill: 12/07/2020  DX: Seropositive rheumatoid arthritis   Current Dose per office note 12/07/2020: methotrexate 15 mg p.o. weekly   Labs: 12/07/2020 Labs look fine to continue methotrexate as planned. CRP is slightly elevated  Okay to refill MTX?

## 2021-01-03 ENCOUNTER — Encounter (HOSPITAL_COMMUNITY): Payer: Medicare HMO

## 2021-01-03 DIAGNOSIS — G4733 Obstructive sleep apnea (adult) (pediatric): Secondary | ICD-10-CM | POA: Diagnosis not present

## 2021-01-05 ENCOUNTER — Encounter (HOSPITAL_COMMUNITY): Payer: Medicare HMO

## 2021-01-06 ENCOUNTER — Encounter (HOSPITAL_COMMUNITY): Payer: Medicare HMO

## 2021-01-09 ENCOUNTER — Encounter (HOSPITAL_COMMUNITY): Payer: Medicare HMO | Admitting: Physical Therapy

## 2021-01-11 ENCOUNTER — Encounter (HOSPITAL_COMMUNITY): Payer: Medicare HMO | Admitting: Physical Therapy

## 2021-01-12 ENCOUNTER — Ambulatory Visit: Payer: Medicare HMO | Admitting: Obstetrics & Gynecology

## 2021-01-12 ENCOUNTER — Other Ambulatory Visit: Payer: Self-pay

## 2021-01-12 ENCOUNTER — Other Ambulatory Visit (HOSPITAL_COMMUNITY)
Admission: RE | Admit: 2021-01-12 | Discharge: 2021-01-12 | Disposition: A | Discharge status: Home / Self Care | Payer: Medicare HMO | Source: Ambulatory Visit | Attending: Obstetrics & Gynecology | Admitting: Obstetrics & Gynecology

## 2021-01-12 ENCOUNTER — Encounter: Payer: Self-pay | Admitting: Obstetrics & Gynecology

## 2021-01-12 VITALS — BP 153/79 | HR 82 | Ht 64.0 in | Wt 185.0 lb

## 2021-01-12 DIAGNOSIS — N898 Other specified noninflammatory disorders of vagina: Secondary | ICD-10-CM | POA: Insufficient documentation

## 2021-01-12 DIAGNOSIS — N760 Acute vaginitis: Secondary | ICD-10-CM

## 2021-01-12 MED ORDER — METRONIDAZOLE 500 MG PO TABS: 500.0000 mg | ORAL_TABLET | Freq: Two times a day (BID) | ORAL | 0 refills | Status: AC

## 2021-01-12 NOTE — Progress Notes (Signed)
   GYN VISIT Patient name: Cynthia Brewer MRN [id number]  Date of birth: 09/25/1950 Chief Complaint:   vaginal odor  History of Present Illness:   Cynthia Brewer is a 70 y.o. [id number] PM, [place] female being seen today for the following concerns:  Vaginal odor: She notes a vaginal odor that has been going on for 58mos.  Most noticeable an hour after her bath.   No discharge, minimal itching.  No vaginal bleeding. Sexually active, same partner x 20+ years.  Denies pain or odor during intercourse.    Does note some urinary frequency- currently being treated for OAB.  Denies dysuria or hematuria.  No LMP recorded. Patient has had a hysterectomy.  Depression screen [facility] 2/9 01/12/2021  Decreased Interest 2  Down, Depressed, Hopeless 0  PHQ - 2 Score 2  Altered sleeping 1  Tired, decreased energy 1  Change in appetite 0  Feeling bad or failure about yourself  0  Trouble concentrating 0  Moving slowly or fidgety/restless 1  Suicidal thoughts 0  PHQ-9 Score 5     Review of Systems:   Pertinent items are noted in HPI Denies fever/chills, dizziness, headaches, visual disturbances, fatigue, shortness of breath, chest pain, abdominal pain, vomiting, bowel movements, urination, or intercourse unless otherwise stated above.  Pertinent History Reviewed:  Reviewed past medical,surgical, social, obstetrical and family history.  Reviewed problem list, medications and allergies. Physical Assessment:   Vitals:   01/12/21 1039  BP: (!) 153/79  Pulse: 82  Weight: 185 lb (83.9 kg)  Height: 5\' 4"  (1.626 m)  Body mass index is 31.76 kg/m.       Physical Examination:   General appearance: alert, well appearing, and in no distress  Psych: mood appropriate, normal affect  Skin: warm & dry   Cardiovascular: normal heart rate noted  Respiratory: normal respiratory effort, no distress  Abdomen: soft, non-tender   Pelvic: VULVA: normal appearing vulva with no masses, tenderness or lesions, VAGINA:  normal appearing vagina, frothy white discharge noted, uterus and cervix surgically absent  Extremities: 2+ ankle edema, no calf tenderness bilaterally  Chaperone: Information systems manager    Assessment & Plan:  1) Acute vaginitis - vaginitis panel obtained -findings suggestive of BV- Rx sent in -f/u prn   Janyth Pupa, DO Attending [place], [place] for [employer], [place]

## 2021-01-13 ENCOUNTER — Encounter (HOSPITAL_COMMUNITY): Payer: Medicare HMO | Admitting: Physical Therapy

## 2021-01-13 LAB — CERVICOVAGINAL ANCILLARY ONLY
Bacterial Vaginitis (gardnerella): NEGATIVE
Candida Glabrata: POSITIVE — AB
Candida Vaginitis: NEGATIVE
Comment: NEGATIVE
Comment: NEGATIVE
Comment: NEGATIVE

## 2021-01-16 ENCOUNTER — Telehealth: Payer: Self-pay

## 2021-01-16 ENCOUNTER — Other Ambulatory Visit: Payer: Self-pay | Admitting: Obstetrics & Gynecology

## 2021-01-16 DIAGNOSIS — B379 Candidiasis, unspecified: Secondary | ICD-10-CM

## 2021-01-16 MED ORDER — BORIC ACID VAGINAL 600 MG VA SUPP: 600.0000 mg | Freq: Every evening | VAGINAL | 0 refills | Status: AC

## 2021-01-16 NOTE — Telephone Encounter (Signed)
Called pt to inquire which compounding pharmacy she wanted the boric acid suppositories sent to. She lives in [place] and chose [facility]. Called Dr Nelda Marseille who will e-scribe for pt. Called pt a second time to confirm.

## 2021-01-16 NOTE — Telephone Encounter (Signed)
-----   Message from Janyth Pupa, DO sent at 01/14/2021 11:05 AM EDT ----- Findings suggestive of specific type of yeast.  Requires treatment with vaginal boric acid.   This will need to b sent in to a compound pharmacy- please let me know where she would like it sent.   Pt use nightly for a week. Additionally, please let her know this is poisonous to eat and should ONLY be placed vaginally.  Please  ----- Message ----- From: Interface, Lab In [place] Sent: 01/13/2021  12:45 PM EDT To: Janyth Pupa, DO

## 2021-01-16 NOTE — Progress Notes (Signed)
Rx send to compound pharmacy

## 2021-01-27 ENCOUNTER — Other Ambulatory Visit: Payer: Self-pay

## 2021-01-27 DIAGNOSIS — R768 Other specified abnormal immunological findings in serum: Secondary | ICD-10-CM

## 2021-01-27 MED ORDER — FOLIC ACID 1 MG PO TABS: 1.0000 mg | ORAL_TABLET | Freq: Every day | ORAL | 0 refills | Status: DC

## 2021-01-27 NOTE — Telephone Encounter (Signed)
Next Visit: 02/08/2021  Last Visit: 12/07/2020  Last Fill: 10/24/2020  Dx: Seropositive rheumatoid arthritis   Current Dose per office note on 8/34/1962: folic acid 1 mg daily  Okay to refill Folic Acid?

## 2021-01-27 NOTE — Telephone Encounter (Signed)
Patient called requesting prescription refill of Folic Acid to be sent to a [facility] at [facility] in [place].

## 2021-01-31 ENCOUNTER — Telehealth: Payer: Self-pay | Admitting: Adult Health

## 2021-01-31 DIAGNOSIS — E782 Mixed hyperlipidemia: Secondary | ICD-10-CM | POA: Diagnosis not present

## 2021-01-31 DIAGNOSIS — Z6830 Body mass index (BMI) 30.0-30.9, adult: Secondary | ICD-10-CM | POA: Diagnosis not present

## 2021-01-31 DIAGNOSIS — R739 Hyperglycemia, unspecified: Secondary | ICD-10-CM | POA: Diagnosis not present

## 2021-01-31 DIAGNOSIS — I10 Essential (primary) hypertension: Secondary | ICD-10-CM | POA: Diagnosis not present

## 2021-01-31 DIAGNOSIS — E7849 Other hyperlipidemia: Secondary | ICD-10-CM | POA: Diagnosis not present

## 2021-01-31 DIAGNOSIS — R600 Localized edema: Secondary | ICD-10-CM | POA: Diagnosis not present

## 2021-01-31 NOTE — Telephone Encounter (Signed)
Refill request for boric acid sent to Dr. Nelda Marseille.

## 2021-01-31 NOTE — Telephone Encounter (Signed)
Pt saw Dr. Nelda Marseille 01/12/2021, states she finished the meds & symptoms got a little better but are back & she's requesting another Rx  Please advise & call pt   [facility]

## 2021-02-01 ENCOUNTER — Other Ambulatory Visit: Payer: Self-pay | Admitting: Obstetrics & Gynecology

## 2021-02-01 DIAGNOSIS — G4733 Obstructive sleep apnea (adult) (pediatric): Secondary | ICD-10-CM | POA: Diagnosis not present

## 2021-02-01 MED ORDER — BORIC ACID VAGINAL 600 MG VA SUPP: 1.0000 | VAGINAL | 2 refills | Status: DC

## 2021-02-01 NOTE — Telephone Encounter (Signed)
Called patient back and informed her that refill has been sent in and that she can use boric acid weekly to manage symptoms.

## 2021-02-01 NOTE — Telephone Encounter (Signed)
Not a problem, I've sent in a refill.  Sometimes, it's helpful to use this once per week to help keep symptoms resolved.  Hope that helps, Dr. Nelda Marseille

## 2021-02-01 NOTE — Progress Notes (Signed)
Pt called back stating infection was not quite relieved.  Refill of boricacid sent in.  See prior note

## 2021-02-02 ENCOUNTER — Telehealth: Payer: Self-pay | Admitting: Obstetrics & Gynecology

## 2021-02-02 NOTE — Telephone Encounter (Signed)
Returned pt's call. Two identifiers used. Informed pt that boric acid suppositories have to be sent to a compounding pharmacy and that's why it couldn't be changed. Pt confirmed understanding.

## 2021-02-02 NOTE — Telephone Encounter (Signed)
Rx was sent to wrong pharmacy  Can we please send to [facility] - Boric Acid Vaginal 600mg  suppository

## 2021-02-08 ENCOUNTER — Ambulatory Visit: Payer: Medicare HMO | Admitting: Internal Medicine

## 2021-02-20 ENCOUNTER — Other Ambulatory Visit: Payer: Self-pay | Admitting: Internal Medicine

## 2021-02-20 DIAGNOSIS — R768 Other specified abnormal immunological findings in serum: Secondary | ICD-10-CM

## 2021-02-21 ENCOUNTER — Ambulatory Visit: Payer: Medicare HMO | Admitting: Internal Medicine

## 2021-03-03 DIAGNOSIS — G4733 Obstructive sleep apnea (adult) (pediatric): Secondary | ICD-10-CM | POA: Diagnosis not present

## 2021-03-06 DIAGNOSIS — I10 Essential (primary) hypertension: Secondary | ICD-10-CM | POA: Diagnosis not present

## 2021-03-06 DIAGNOSIS — R300 Dysuria: Secondary | ICD-10-CM | POA: Diagnosis not present

## 2021-03-14 ENCOUNTER — Other Ambulatory Visit: Payer: Self-pay | Admitting: Internal Medicine

## 2021-03-14 DIAGNOSIS — M059 Rheumatoid arthritis with rheumatoid factor, unspecified: Secondary | ICD-10-CM

## 2021-03-14 NOTE — Telephone Encounter (Signed)
Next Visit: 03/29/2021  Last Visit: 12/07/2020  Last Fill: 01/02/2021  DX: Seropositive rheumatoid arthritis   Current Dose per office note 12/07/2020: methotrexate 15 mg p.o. weekly   Labs: 12/07/2020 Labs look fine to continue methotrexate as planned.   Okay to refill MTX?

## 2021-03-14 NOTE — Telephone Encounter (Signed)
Patient called the office requesting a refill of Methotrexate 2.5 mg [facility] [place] [facility] [address].

## 2021-03-15 MED ORDER — METHOTREXATE 2.5 MG PO TABS: ORAL_TABLET | ORAL | 0 refills | Status: DC

## 2021-03-21 ENCOUNTER — Other Ambulatory Visit: Payer: Self-pay | Admitting: Internal Medicine

## 2021-03-21 DIAGNOSIS — R768 Other specified abnormal immunological findings in serum: Secondary | ICD-10-CM

## 2021-03-21 NOTE — Telephone Encounter (Signed)
Next Visit: 03/29/2021   Last Visit: 12/07/2020   Last Fill: 01/27/2021  DX: Seropositive rheumatoid arthritis    Current Dose per office note 08/13/1290: folic acid 1 mg daily  Okay to refill Folic Acid?

## 2021-03-24 ENCOUNTER — Ambulatory Visit (INDEPENDENT_AMBULATORY_CARE_PROVIDER_SITE_OTHER): Payer: Medicare HMO | Admitting: Internal Medicine

## 2021-03-24 ENCOUNTER — Other Ambulatory Visit: Payer: Self-pay

## 2021-03-24 ENCOUNTER — Encounter: Payer: Self-pay | Admitting: Internal Medicine

## 2021-03-24 VITALS — BP 165/75 | HR 79 | Resp 16 | Ht 64.0 in | Wt 183.0 lb

## 2021-03-24 DIAGNOSIS — Z79899 Other long term (current) drug therapy: Secondary | ICD-10-CM | POA: Diagnosis not present

## 2021-03-24 DIAGNOSIS — R768 Other specified abnormal immunological findings in serum: Secondary | ICD-10-CM

## 2021-03-24 DIAGNOSIS — M059 Rheumatoid arthritis with rheumatoid factor, unspecified: Secondary | ICD-10-CM

## 2021-03-24 NOTE — Progress Notes (Signed)
Office Visit Note  Patient: Cynthia Brewer             Date of Birth: 1950-06-03           MRN: [id number]             PCP: Curlene Labrum, MD Referring: Curlene Labrum, MD Visit Date: 03/24/2021   Subjective:  Follow-up (Hair loss)   History of Present Illness: Cynthia Brewer is a 71 y.o. female here for follow up for seropositive RA on methotrexate 15 mg PO weekly and folic acid 1 mg daily. Since our last visit she discontinued amlodipine with improvement in her leg edema and associated pain. Her arthritis is doing well overall and no recurrence of left knee swelling. She is concerned about worsening hair loss and wants to discontinue methotrexate because of this problem.  Previous HPI 12/07/20 Cynthia Brewer is a 71 y.o. female here for follow up for seropositive RA after starting methotrexate 15 mg PO weekly. Her start was slightly delayed due to probably viral URI symptoms. She was also referred for lymphedema therapy at recommendation of nursing evaluation.  Left knee pain continues to be the biggest problem for her.  Bilateral hand swelling and stiffness also ongoing for 30 minutes or less per day.  She has not yet seen [facility] for starting further lymphedema treatment sounds like discussed some type of lower extremity compression.   10/07/20 Cynthia Brewer is a 71 y.o. female here for inflammation of the left knee with positive ANA and rheumatoid factor. She reports knee pain and swelling started less than 1 year ago but does not recall an exact onset time. She does not recall any preceding events or medication changes associated with this.  For this she had some joint pain and stiffness in her bilateral hands that has been ongoing for about 3 years.  She has not typically noticed swelling in her finger joints describes an occasional appearance of dark or red discoloration MCP joints and distally.  Knee aspiration with steroid injection was performed last month with  15 cc of fluid of fairly bland fluid no significant neutrophil elevation or crystals present.  She noticed a symptom benefit but relatively rapid return of symptoms.   Review of Systems  Constitutional:  Negative for fatigue.  HENT:  Negative for mouth dryness.   Eyes:  Negative for dryness.  Respiratory:  Positive for shortness of breath.   Cardiovascular:  Negative for swelling in legs/feet.  Gastrointestinal:  Negative for constipation.  Endocrine: Negative for excessive thirst.  Genitourinary:  Negative for difficulty urinating.  Musculoskeletal:  Positive for morning stiffness.  Skin:  Positive for hair loss.  Allergic/Immunologic: Negative for susceptible to infections.  Neurological:  Negative for numbness.  Hematological:  Negative for bruising/bleeding tendency.  Psychiatric/Behavioral:  Negative for sleep disturbance.    PMFS History:  Patient Active Problem List   Diagnosis Date Noted   High risk medication use 10/20/2020   Seropositive rheumatoid arthritis ([place]) 10/07/2020   Positive ANA (antinuclear antibody) 10/07/2020   Effusion, left knee 10/07/2020   Bilateral hand pain 10/07/2020   IBS (irritable bowel syndrome) 05/23/2020   Hematemesis 05/23/2020   OSA (obstructive sleep apnea) 11/12/2019   Essential hypertension 08/28/2019   Hyperlipidemia 08/28/2019   DOE (dyspnea on exertion) 08/28/2019   Atypical chest pain 08/28/2019   History of parotidectomy 10/27/2018   Rectal bleeding 08/21/2017   History of colon polyps 05/09/2017   Latent tuberculosis  by skin test 01/03/2017   Chronic cough 01/03/2017   Asthma-COPD overlap syndrome ([place]) 01/03/2017    Past Medical History:  Diagnosis Date   Anxiety    Arthritis    hands and knees   COPD (chronic obstructive pulmonary disease) (HCC)    Depression    GERD (gastroesophageal reflux disease)    Hypertension    Mass of right parotid gland     Family History  Problem Relation Age of Onset   Healthy Mother     Healthy Father    Depression Sister    Hypertension Sister    Sleep apnea Sister    Diabetes Sister    Hypertension Sister    Healthy Brother    Throat cancer Brother    Healthy Brother    Autism Brother    Healthy Daughter    Healthy Daughter    Healthy Son    Past Surgical History:  Procedure Laterality Date   ABDOMINAL HYSTERECTOMY     BIOPSY  06/01/2020   Procedure: BIOPSY;  Surgeon: Harvel Quale, MD;  [facility];  Service: Gastroenterology;;   BREAST SURGERY Bilateral    breast bx   CERVICAL SPINE SURGERY     ESOPHAGOGASTRODUODENOSCOPY (EGD) WITH PROPOFOL N/A 06/01/2020   Procedure: ESOPHAGOGASTRODUODENOSCOPY (EGD) WITH PROPOFOL;  Surgeon: Harvel Quale, MD;  [facility];  Service: Gastroenterology;  Laterality: N/A;  AM   HERNIA REPAIR     UHR   PAROTIDECTOMY Right 10/27/2018   Procedure: RIGHT PAROTIDECTOMY;  Surgeon: Leta Baptist, MD;  Location: [place];  Service: ENT;  Laterality: Right;   POLYPECTOMY  06/01/2020   Procedure: POLYPECTOMY;  Surgeon: Harvel Quale, MD;  [facility];  Service: Gastroenterology;;  duodenal nodule;    Social History   Social History Narrative   Not on file   Immunization History  Administered Date(s) Administered   Influenza, High Dose Seasonal PF 01/11/2018   Moderna Sars-Covid-2 Vaccination 05/15/2019, 06/12/2019, 01/25/2020, 07/29/2020   Pneumococcal Polysaccharide-23 01/12/2017     Objective: Vital Signs: BP (!) 165/75 (BP Location: Left Arm, Patient Position: Sitting, Cuff Size: Normal)    Pulse 79    Resp 16    Ht 5\' 4"  (1.626 m)    Wt 183 lb (83 kg)    BMI 31.41 kg/m    Physical Exam Cardiovascular:     Rate and Rhythm: Normal rate and regular rhythm.  Pulmonary:     Effort: Pulmonary effort is normal.     Breath sounds: Normal breath sounds.  Skin:    General: Skin is warm and dry.     Findings: No rash.  Neurological:     Mental  Status: She is alert.  Psychiatric:        Mood and Affect: Mood normal.     Musculoskeletal Exam:  Shoulders full ROM no tenderness or swelling Elbows full ROM no tenderness or swelling Wrists full ROM no tenderness or swelling Fingers full ROM no tenderness or swelling Knees full ROM no tenderness or swelling Ankles full ROM no tenderness or swelling   CDAI Exam: CDAI Score: 0  Patient Global: 0 mm; Provider Global: 0 mm Swollen: 0 ; Tender: 0  Joint Exam 03/24/2021   All documented joints were normal     Investigation: No additional findings.  Imaging: No results found.  Recent Labs: Lab Results  Component Value Date   WBC 5.3 03/24/2021   HGB 14.0 03/24/2021   PLT  233 03/24/2021   NA 140 03/24/2021   K 4.6 03/24/2021   CL 105 03/24/2021   CO2 31 03/24/2021   GLUCOSE 94 03/24/2021   BUN 17 03/24/2021   CREATININE 1.25 (H) 03/24/2021   BILITOT 0.6 03/24/2021   AST 13 03/24/2021   ALT 14 03/24/2021   PROT 6.7 03/24/2021   CALCIUM 8.9 03/24/2021   GFRAA 57 (L) 10/23/2018   QFTBGOLDPLUS NEGATIVE 10/20/2020    Speciality Comments: No specialty comments available.  Procedures:  No procedures performed Allergies: Penicillins   Assessment / Plan:     Visit Diagnoses: Seropositive rheumatoid arthritis ([place])  She reports symptoms are very well improved without significant joint pains at this time.  Leg swelling is greatly improved seems more related to her pedal edema and change in antihypertensive drugs than inflammatory activity.  This seems as good a time is any to try decreasing medications as requested.  Instructed to have methotrexate dose for the next 2 weeks then try discontinuing completely.  If symptoms worsen after treatment withdrawal can try starting hydroxychloroquine which is not associated with the alopecia-she has no evidence of erosive disease.  High risk medication use - Plan: CBC with Differential/Platelet, COMPLETE METABOLIC PANEL WITH  GFR  Checking CBC and CMP today for methotrexate toxicity monitoring.  Orders: Orders Placed This Encounter  Procedures   CBC with Differential/Platelet   COMPLETE METABOLIC PANEL WITH GFR   No orders of the defined types were placed in this encounter.    Follow-Up Instructions: Return in about 1 year (around 03/24/2022) for RA discontinuing treatment f/u 1 yr or prn.   Collier Salina, MD  Note - This record has been created using [employer].  Chart creation errors have been sought, but may not always  have been located. Such creation errors do not reflect on  the standard of medical care.

## 2021-03-25 LAB — CBC WITH DIFFERENTIAL/PLATELET
Absolute Monocytes: 360 cells/uL (ref 200–950)
Basophils Absolute: 58 cells/uL (ref 0–200)
Basophils Relative: 1.1 %
Eosinophils Absolute: 159 cells/uL (ref 15–500)
Eosinophils Relative: 3 %
HCT: 42.7 % (ref 35.0–45.0)
Hemoglobin: 14 g/dL (ref 11.7–15.5)
Lymphs Abs: 2502 cells/uL (ref 850–3900)
MCH: 31.5 pg (ref 27.0–33.0)
MCHC: 32.8 g/dL (ref 32.0–36.0)
MCV: 96 fL (ref 80.0–100.0)
MPV: 9.7 fL (ref 7.5–12.5)
Monocytes Relative: 6.8 %
Neutro Abs: 2221 cells/uL (ref 1500–7800)
Neutrophils Relative %: 41.9 %
Platelets: 233 10*3/uL (ref 140–400)
RBC: 4.45 10*6/uL (ref 3.80–5.10)
RDW: 12.5 % (ref 11.0–15.0)
Total Lymphocyte: 47.2 %
WBC: 5.3 10*3/uL (ref 3.8–10.8)

## 2021-03-25 LAB — COMPLETE METABOLIC PANEL WITH GFR
AG Ratio: 1.2 (calc) (ref 1.0–2.5)
ALT: 14 U/L (ref 6–29)
AST: 13 U/L (ref 10–35)
Albumin: 3.7 g/dL (ref 3.6–5.1)
Alkaline phosphatase (APISO): 63 U/L (ref 37–153)
BUN/Creatinine Ratio: 14 (calc) (ref 6–22)
BUN: 17 mg/dL (ref 7–25)
CO2: 31 mmol/L (ref 20–32)
Calcium: 8.9 mg/dL (ref 8.6–10.4)
Chloride: 105 mmol/L (ref 98–110)
Creat: 1.25 mg/dL — ABNORMAL HIGH (ref 0.60–1.00)
Globulin: 3 g/dL (calc) (ref 1.9–3.7)
Glucose, Bld: 94 mg/dL (ref 65–99)
Potassium: 4.6 mmol/L (ref 3.5–5.3)
Sodium: 140 mmol/L (ref 135–146)
Total Bilirubin: 0.6 mg/dL (ref 0.2–1.2)
Total Protein: 6.7 g/dL (ref 6.1–8.1)
eGFR: 46 mL/min/{1.73_m2} — ABNORMAL LOW (ref >=60)

## 2021-03-27 NOTE — Progress Notes (Signed)
Lab results look good with the methotrexate. Her kidney function is mildly impaired but probably not a new change, estimated GFR 46 compared to 52. She can try decreasing and then stopping the methotrexate like we discussed. She can let us know if symptoms get worse.

## 2021-03-29 ENCOUNTER — Ambulatory Visit: Payer: Medicare HMO | Admitting: Internal Medicine

## 2021-04-03 DIAGNOSIS — G4733 Obstructive sleep apnea (adult) (pediatric): Secondary | ICD-10-CM | POA: Diagnosis not present

## 2021-04-06 DIAGNOSIS — G4733 Obstructive sleep apnea (adult) (pediatric): Secondary | ICD-10-CM | POA: Diagnosis not present

## 2021-04-17 ENCOUNTER — Other Ambulatory Visit: Payer: Self-pay | Admitting: Internal Medicine

## 2021-04-17 DIAGNOSIS — M059 Rheumatoid arthritis with rheumatoid factor, unspecified: Secondary | ICD-10-CM

## 2021-05-04 DIAGNOSIS — G4733 Obstructive sleep apnea (adult) (pediatric): Secondary | ICD-10-CM | POA: Diagnosis not present

## 2021-05-25 DIAGNOSIS — R739 Hyperglycemia, unspecified: Secondary | ICD-10-CM | POA: Diagnosis not present

## 2021-05-25 DIAGNOSIS — I10 Essential (primary) hypertension: Secondary | ICD-10-CM | POA: Diagnosis not present

## 2021-05-25 DIAGNOSIS — Z6830 Body mass index (BMI) 30.0-30.9, adult: Secondary | ICD-10-CM | POA: Diagnosis not present

## 2021-05-25 DIAGNOSIS — E7849 Other hyperlipidemia: Secondary | ICD-10-CM | POA: Diagnosis not present

## 2021-05-29 ENCOUNTER — Other Ambulatory Visit: Payer: Self-pay | Admitting: *Deleted

## 2021-05-29 DIAGNOSIS — M059 Rheumatoid arthritis with rheumatoid factor, unspecified: Secondary | ICD-10-CM

## 2021-05-29 NOTE — Telephone Encounter (Signed)
Patient was discontinuing methotrexate due to symptom improvement, kidney function changes, and disliking the hair loss from medication. If symptoms flared up after this recommending going on to hydroxychloroquine as alternative treatment.

## 2021-05-29 NOTE — Telephone Encounter (Signed)
Patient contacted the office requesting a refill on MTX ? ?Next Visit: 03/27/2022 ? ?Last Visit: 03/24/2021 ? ?Last Fill: 03/15/2022 ? ?DX: Seropositive rheumatoid arthritis  ? ?Current Dose per office note 03/24/2021: have methotrexate dose for the next 2 weeks then try discontinuing completely ? ?Labs: 03/24/2021 Lab results look good with the methotrexate. Her kidney function is mildly impaired but probably not a new change, estimated GFR 46 compared to 52. ? ?Do you want to refill MTX or should patient discontinue use?  ?

## 2021-05-30 NOTE — Telephone Encounter (Signed)
Patient advised Dr. Benjamine Mola was discontinuing methotrexate due to symptom improvement, kidney function changes, and her disliking the hair loss from medication. If symptoms flared up after this recommending going on to hydroxychloroquine as alternative treatment. Patient advised to contact the office if she starts to flare. Patient expressed understanding.  ?

## 2021-05-31 ENCOUNTER — Telehealth: Payer: Self-pay | Admitting: Internal Medicine

## 2021-05-31 NOTE — Telephone Encounter (Signed)
Patient advised since she has stopped the Methotrexate she can stop the Folic Acid.  ?

## 2021-05-31 NOTE — Telephone Encounter (Signed)
Patient called the office stating she was told to stop Methotrexate at her last appointment with Dr. Benjamine Mola and wants to know if she needs to stop folic acid also. Patient requests a call back. ?

## 2021-06-06 DIAGNOSIS — Z1231 Encounter for screening mammogram for malignant neoplasm of breast: Secondary | ICD-10-CM | POA: Diagnosis not present

## 2021-06-07 DIAGNOSIS — H16223 Keratoconjunctivitis sicca, not specified as Sjogren's, bilateral: Secondary | ICD-10-CM | POA: Diagnosis not present

## 2021-06-07 DIAGNOSIS — H2513 Age-related nuclear cataract, bilateral: Secondary | ICD-10-CM | POA: Diagnosis not present

## 2021-06-19 ENCOUNTER — Other Ambulatory Visit: Payer: Self-pay

## 2021-06-20 ENCOUNTER — Other Ambulatory Visit: Payer: Self-pay | Admitting: *Deleted

## 2021-06-20 MED ORDER — BORIC ACID VAGINAL 600 MG VA SUPP: 1.0000 | VAGINAL | 2 refills | Status: DC

## 2021-06-24 ENCOUNTER — Other Ambulatory Visit: Payer: Self-pay | Admitting: Internal Medicine

## 2021-06-24 DIAGNOSIS — R768 Other specified abnormal immunological findings in serum: Secondary | ICD-10-CM

## 2021-07-07 DIAGNOSIS — G4733 Obstructive sleep apnea (adult) (pediatric): Secondary | ICD-10-CM | POA: Diagnosis not present

## 2021-07-24 ENCOUNTER — Encounter: Payer: Self-pay | Admitting: Adult Health

## 2021-07-24 ENCOUNTER — Ambulatory Visit: Payer: Medicare HMO | Admitting: Adult Health

## 2021-07-24 VITALS — BP 167/77 | HR 84 | Ht 64.0 in | Wt 179.0 lb

## 2021-07-24 DIAGNOSIS — N898 Other specified noninflammatory disorders of vagina: Secondary | ICD-10-CM | POA: Diagnosis not present

## 2021-07-24 DIAGNOSIS — B9689 Other specified bacterial agents as the cause of diseases classified elsewhere: Secondary | ICD-10-CM

## 2021-07-24 DIAGNOSIS — N760 Acute vaginitis: Secondary | ICD-10-CM | POA: Diagnosis not present

## 2021-07-24 LAB — POCT WET PREP (WET MOUNT): Clue Cells Wet Prep Whiff POC: NEGATIVE

## 2021-07-24 MED ORDER — METRONIDAZOLE 500 MG PO TABS: 500.0000 mg | ORAL_TABLET | Freq: Two times a day (BID) | ORAL | 0 refills | Status: DC

## 2021-07-24 NOTE — Progress Notes (Signed)
?  Subjective:  ?  ? Patient ID: Cynthia Brewer, female   DOB: Jun 29, 1950, 71 y.o.   MRN: [id number] ? ?HPI ?Cynthia Brewer is a 71 year old black female,married, sp hysterectomy in complaining of vaginal discharge with odor, has used boric acid with out relief. ?PCP is Dr Pleas Koch  ? ?Review of Systems ?Has vaginal discharge with odor, has used boric acid ?Has some itching in clitoris area  ?Was treated for candida glabrata in the past ?Reviewed past medical,surgical, social and family history. Reviewed medications and allergies.  ?   ?Objective:  ? Physical Exam ?BP (!) 167/77 (BP Location: Right Arm, Patient Position: Sitting, Cuff Size: Normal)   Pulse 84   Ht '5\' 4"'$  (1.626 m)   Wt 179 lb (81.2 kg)   BMI 30.73 kg/m?   ?  Skin warm and dry.Pelvic: external genitalia is normal in appearance no lesions, vagina: white discharge without odor,urethra has no lesions or masses noted, cervix and uterus are absent,adnexa: no masses or tenderness noted. Bladder is non tender and no masses felt. Wet prep: + for clue cells and +WBCs. ?Examination chaperoned by Celene Squibb LPN ? ?Assessment:  ?   ?1. Vaginal odor ? ?2. Vaginal discharge ? ?3. BV (bacterial vaginosis) ?Had +clues cells on Wet prep. ?Will rx flagyl, no sex or alcohol during treatment ?Meds ordered this encounter  ?Medications  ? metroNIDAZOLE (FLAGYL) 500 MG tablet  ?  Sig: Take 1 tablet (500 mg total) by mouth 2 (two) times daily.  ?  Dispense:  14 tablet  ?  Refill:  0  ?  Order Specific Question:   Supervising Provider  ?  Answer:   Tania Ade H [[id number]]  ?  ? ?   ?Plan:  ?   ?Follow up prn  ?   ?

## 2021-08-02 DIAGNOSIS — G4733 Obstructive sleep apnea (adult) (pediatric): Secondary | ICD-10-CM | POA: Diagnosis not present

## 2021-08-21 ENCOUNTER — Telehealth: Payer: Self-pay

## 2021-08-21 MED ORDER — FLUCONAZOLE 150 MG PO TABS: ORAL_TABLET | ORAL | 1 refills | Status: DC

## 2021-08-21 NOTE — Telephone Encounter (Signed)
Has discharge with itching, odor will rx diflucan

## 2021-08-21 NOTE — Addendum Note (Signed)
Addended by: Derrek Monaco A on: 08/21/2021 05:21 PM   Modules accepted: Orders

## 2021-08-21 NOTE — Telephone Encounter (Signed)
Pt still has a discharge even after taking Flagyl. Pt states discharge has gotten some better but is still there. Please advise. Thanks! [place]

## 2021-08-21 NOTE — Telephone Encounter (Signed)
PT CALLED AND STATED THAT SHE WOULD LIKE SOMETHING ELSE CALLED IN THE MEDICATION THAT SHE GOT ON HER LAST VISIT IS NOT HELPING.

## 2021-08-22 ENCOUNTER — Other Ambulatory Visit: Payer: Self-pay | Admitting: Internal Medicine

## 2021-08-22 DIAGNOSIS — M059 Rheumatoid arthritis with rheumatoid factor, unspecified: Secondary | ICD-10-CM

## 2021-08-31 DIAGNOSIS — I10 Essential (primary) hypertension: Secondary | ICD-10-CM | POA: Diagnosis not present

## 2021-08-31 DIAGNOSIS — Z6830 Body mass index (BMI) 30.0-30.9, adult: Secondary | ICD-10-CM | POA: Diagnosis not present

## 2021-09-07 DIAGNOSIS — H25813 Combined forms of age-related cataract, bilateral: Secondary | ICD-10-CM | POA: Diagnosis not present

## 2021-09-07 DIAGNOSIS — H01001 Unspecified blepharitis right upper eyelid: Secondary | ICD-10-CM | POA: Diagnosis not present

## 2021-09-07 DIAGNOSIS — H01002 Unspecified blepharitis right lower eyelid: Secondary | ICD-10-CM | POA: Diagnosis not present

## 2021-09-07 DIAGNOSIS — H01004 Unspecified blepharitis left upper eyelid: Secondary | ICD-10-CM | POA: Diagnosis not present

## 2021-09-26 ENCOUNTER — Ambulatory Visit: Payer: Medicare HMO | Admitting: Adult Health

## 2021-09-27 DIAGNOSIS — I10 Essential (primary) hypertension: Secondary | ICD-10-CM | POA: Diagnosis not present

## 2021-09-27 DIAGNOSIS — I119 Hypertensive heart disease without heart failure: Secondary | ICD-10-CM | POA: Diagnosis not present

## 2021-09-27 DIAGNOSIS — E782 Mixed hyperlipidemia: Secondary | ICD-10-CM | POA: Diagnosis not present

## 2021-10-05 ENCOUNTER — Ambulatory Visit (INDEPENDENT_AMBULATORY_CARE_PROVIDER_SITE_OTHER): Payer: Medicare HMO | Admitting: Adult Health

## 2021-10-05 ENCOUNTER — Other Ambulatory Visit (HOSPITAL_COMMUNITY)
Admission: RE | Admit: 2021-10-05 | Discharge: 2021-10-05 | Disposition: A | Discharge status: Home / Self Care | Payer: Medicare HMO | Source: Ambulatory Visit | Attending: Adult Health | Admitting: Adult Health

## 2021-10-05 ENCOUNTER — Encounter: Payer: Self-pay | Admitting: Adult Health

## 2021-10-05 VITALS — BP 157/75 | HR 77 | Ht 64.0 in | Wt 182.5 lb

## 2021-10-05 DIAGNOSIS — N898 Other specified noninflammatory disorders of vagina: Secondary | ICD-10-CM

## 2021-10-05 DIAGNOSIS — L292 Pruritus vulvae: Secondary | ICD-10-CM

## 2021-10-05 MED ORDER — TRIAMCINOLONE ACETONIDE 0.5 % EX OINT: 1.0000 | TOPICAL_OINTMENT | Freq: Two times a day (BID) | CUTANEOUS | 0 refills | Status: DC

## 2021-10-05 NOTE — Progress Notes (Signed)
  Subjective:     Patient ID: Cynthia Brewer, female   DOB: [birth year], 71 y.o.   MRN: [id number]  HPI Cynthia Brewer is a 71 year old black female,married, sp hysterectomy in complaining of vaginal discharge and itching on vulva area. Was treated for BV in May, and was better. PCP is Dr Pleas Koch.   Review of Systems Has vaginal discharge Has vulva itching Had sex yesterday, not any worse since then Reviewed past medical,surgical, social and family history. Reviewed medications and allergies.     Objective:   Physical Exam BP (!) 157/75 (BP Location: Left Arm, Patient Position: Sitting, Cuff Size: Normal)   Pulse 77   Ht '5\' 4"'$  (1.626 m)   Wt 182 lb 8 oz (82.8 kg)   BMI 31.33 kg/m     Skin warm and dry.Pelvic: external genitalia is normal in appearance no lesions, vagina: scant white discharge without odor,urethra has no lesions or masses noted, cervix and uterus are absent, adnexa: no masses or tenderness noted. Bladder is non tender and no masses felt.CV swab obtained. Fall risk is low  Upstream - 10/05/21 1039       Pregnancy Intention Screening   Does the patient want to become pregnant in the next year? N/A    Does the patient's partner want to become pregnant in the next year? N/A    Would the patient like to discuss contraceptive options today? N/A      Contraception Wrap Up   Current Method Female Sterilization   hyst   End Method Female Sterilization   hyst   Contraception Counseling Provided No            Examination chaperoned by Levy Pupa LPN  Assessment:     1. Vaginal discharge CV swab sent for BV and yeast  - Cervicovaginal ancillary only( [place])  2. Vulvar itching CV swab sent Will rx kenalog to apply to outside tissues  Meds ordered this encounter  Medications   triamcinolone ointment (KENALOG) 0.5 %    Sig: Apply 1 Application topically 2 (two) times daily.    Dispense:  30 g    Refill:  0    Order Specific Question:   Supervising Provider     Answer:   Tania Ade H [[id number]]    - Cervicovaginal ancillary only( [place])     Plan:     Follow up prn

## 2021-10-06 LAB — CERVICOVAGINAL ANCILLARY ONLY
Bacterial Vaginitis (gardnerella): NEGATIVE
Candida Glabrata: NEGATIVE
Candida Vaginitis: NEGATIVE
Comment: NEGATIVE
Comment: NEGATIVE
Comment: NEGATIVE

## 2021-10-09 ENCOUNTER — Telehealth: Payer: Self-pay | Admitting: Adult Health

## 2021-10-09 ENCOUNTER — Telehealth: Payer: Self-pay | Admitting: *Deleted

## 2021-10-09 DIAGNOSIS — I10 Essential (primary) hypertension: Secondary | ICD-10-CM | POA: Diagnosis not present

## 2021-10-09 NOTE — Telephone Encounter (Signed)
Pt would like a call back with her results.

## 2021-10-09 NOTE — Telephone Encounter (Signed)
-----   Message from Estill Dooms, NP sent at 10/06/2021  2:05 PM EDT ----- Let her know vaginal swab was negative

## 2021-10-09 NOTE — Telephone Encounter (Signed)
Pt aware vaginal swab was negative. Pt voiced understanding. JSY 

## 2021-10-16 ENCOUNTER — Other Ambulatory Visit: Payer: Self-pay | Admitting: Adult Health

## 2021-10-20 DIAGNOSIS — I10 Essential (primary) hypertension: Secondary | ICD-10-CM | POA: Diagnosis not present

## 2021-10-20 DIAGNOSIS — R609 Edema, unspecified: Secondary | ICD-10-CM | POA: Diagnosis not present

## 2021-10-20 DIAGNOSIS — R32 Unspecified urinary incontinence: Secondary | ICD-10-CM | POA: Diagnosis not present

## 2021-10-20 DIAGNOSIS — E669 Obesity, unspecified: Secondary | ICD-10-CM | POA: Diagnosis not present

## 2021-10-20 DIAGNOSIS — R69 Illness, unspecified: Secondary | ICD-10-CM | POA: Diagnosis not present

## 2021-10-20 DIAGNOSIS — I4891 Unspecified atrial fibrillation: Secondary | ICD-10-CM | POA: Diagnosis not present

## 2021-10-20 DIAGNOSIS — L299 Pruritus, unspecified: Secondary | ICD-10-CM | POA: Diagnosis not present

## 2021-10-20 DIAGNOSIS — Z6831 Body mass index (BMI) 31.0-31.9, adult: Secondary | ICD-10-CM | POA: Diagnosis not present

## 2021-10-20 DIAGNOSIS — G8929 Other chronic pain: Secondary | ICD-10-CM | POA: Diagnosis not present

## 2021-10-20 DIAGNOSIS — K219 Gastro-esophageal reflux disease without esophagitis: Secondary | ICD-10-CM | POA: Diagnosis not present

## 2021-10-20 DIAGNOSIS — Z833 Family history of diabetes mellitus: Secondary | ICD-10-CM | POA: Diagnosis not present

## 2021-10-26 DIAGNOSIS — H25811 Combined forms of age-related cataract, right eye: Secondary | ICD-10-CM | POA: Diagnosis not present

## 2021-10-30 ENCOUNTER — Encounter (HOSPITAL_COMMUNITY)
Admission: RE | Admit: 2021-10-30 | Discharge: 2021-10-30 | Disposition: A | Discharge status: Home / Self Care | Payer: Medicare HMO | Source: Ambulatory Visit | Attending: Ophthalmology | Admitting: Ophthalmology

## 2021-10-30 ENCOUNTER — Other Ambulatory Visit: Payer: Self-pay

## 2021-10-30 ENCOUNTER — Encounter (HOSPITAL_COMMUNITY): Payer: Self-pay

## 2021-11-01 NOTE — H&P (Signed)
Surgical History & Physical  Patient Name: Cynthia Brewer DOB: [birth year]  Surgery: Cataract extraction with intraocular lens implant phacoemulsification; Right Eye  Surgeon: Baruch Goldmann MD Surgery Date:  11-03-21 Pre-Op Date:  10-26-21  HPI: A 45 Yr. old female patient 1. 1. The patient is presenting for a cataract evaluation of both eyes. Referred by Dr. Hassell Done. The patient's vision is blurry. The complaint is associated with difficulty reading small print on medicine bottles/labels, driving at night due to halos/glare, and poor night vision. This is negatively affecting the patient's quality of life and the patient is unable to function adequately in life with the current level of vision. HPI was performed by Baruch Goldmann .  Medical History: Cataracts Glaucoma High Blood Pressure Thyroid Problems  Review of Systems Negative Allergic/Immunologic Negative Cardiovascular Negative Constitutional Negative Ear, Nose, Mouth & Throat Negative Endocrine Negative Eyes Negative Gastrointestinal Negative Genitourinary Negative Hemotologic/Lymphatic Negative Integumentary Negative Musculoskeletal Negative Neurological Negative Psychiatry Negative Respiratory  Social   Never smoked   Medication Apiprazol, Carvedilol, Omeprazole, Magnesium, Fluoxetine, HCTZ, Klor-Con, Losartan, Pravastatin,   Sx/Procedures Cyst on neck removal, Neck sx, Hernia Sx,   Drug Allergies  [place],   History & Physical: Heent: Cataract, right NECK: supple without bruits LUNGS: lungs clear to auscultation CV: regular rate and rhythm Abdomen: soft and non-tender Impression & Plan: Assessment: 1.  COMBINED FORMS AGE RELATED CATARACT; Both Eyes (H25.813) 2.  BLEPHARITIS; Right Upper Lid, Right Lower Lid, Left Upper Lid, Left Lower Lid (H01.001, H01.002,H01.004,H01.005) 3.  ASTIGMATISM, REGULAR; Both Eyes (H52.223) 4.  Dry Eye Syndrome; Both Eyes (H04.123)  Plan: 1.  Cataract accounts for the  patient's decreased vision. This visual impairment is not correctable with a tolerable change in glasses or contact lenses. Cataract surgery with an implantation of a new lens should significantly improve the visual and functional status of the patient. Discussed all risks, benefits, alternatives, and potential complications. Discussed the procedures and recovery. Patient desires to have surgery. A-scan ordered and performed today for intra-ocular lens calculations. The surgery will be performed in order to improve vision for driving, reading, and for eye examinations. Recommend phacoemulsification with intra-ocular lens. Recommend Dextenza for post-operative pain and inflammation. Right Eye worse - first. Dilates well - shugarcaine by protocol. Malyugin Ring. Omidira. Patient is interested in not needing any glasses after surgery - Vivity Toric.  2.  Recommend regular lid cleaning.  3.  Would need toric IOL if doing Vivity.  4.  Artificial tears 1 drop 2-3x/day.

## 2021-11-03 ENCOUNTER — Encounter (HOSPITAL_COMMUNITY)
Admission: RE | Disposition: A | Discharge status: Home / Self Care | Payer: Self-pay | Source: Home / Self Care | Attending: Ophthalmology

## 2021-11-03 ENCOUNTER — Ambulatory Visit (HOSPITAL_COMMUNITY): Payer: Medicare HMO | Admitting: Anesthesiology

## 2021-11-03 ENCOUNTER — Ambulatory Visit (HOSPITAL_COMMUNITY)
Admission: RE | Admit: 2021-11-03 | Discharge: 2021-11-03 | Disposition: A | Discharge status: Home / Self Care | Payer: Medicare HMO | Attending: Ophthalmology | Admitting: Ophthalmology

## 2021-11-03 ENCOUNTER — Encounter (HOSPITAL_COMMUNITY): Payer: Self-pay | Admitting: Ophthalmology

## 2021-11-03 ENCOUNTER — Other Ambulatory Visit: Payer: Self-pay

## 2021-11-03 ENCOUNTER — Ambulatory Visit (HOSPITAL_BASED_OUTPATIENT_CLINIC_OR_DEPARTMENT_OTHER): Payer: Medicare HMO | Admitting: Anesthesiology

## 2021-11-03 DIAGNOSIS — H01001 Unspecified blepharitis right upper eyelid: Secondary | ICD-10-CM | POA: Diagnosis not present

## 2021-11-03 DIAGNOSIS — K219 Gastro-esophageal reflux disease without esophagitis: Secondary | ICD-10-CM | POA: Insufficient documentation

## 2021-11-03 DIAGNOSIS — H01004 Unspecified blepharitis left upper eyelid: Secondary | ICD-10-CM | POA: Diagnosis not present

## 2021-11-03 DIAGNOSIS — I10 Essential (primary) hypertension: Secondary | ICD-10-CM

## 2021-11-03 DIAGNOSIS — H52223 Regular astigmatism, bilateral: Secondary | ICD-10-CM | POA: Insufficient documentation

## 2021-11-03 DIAGNOSIS — H01002 Unspecified blepharitis right lower eyelid: Secondary | ICD-10-CM | POA: Diagnosis not present

## 2021-11-03 DIAGNOSIS — H269 Unspecified cataract: Secondary | ICD-10-CM

## 2021-11-03 DIAGNOSIS — J449 Chronic obstructive pulmonary disease, unspecified: Secondary | ICD-10-CM | POA: Diagnosis not present

## 2021-11-03 DIAGNOSIS — G4730 Sleep apnea, unspecified: Secondary | ICD-10-CM | POA: Diagnosis not present

## 2021-11-03 DIAGNOSIS — H04123 Dry eye syndrome of bilateral lacrimal glands: Secondary | ICD-10-CM | POA: Diagnosis not present

## 2021-11-03 DIAGNOSIS — H25811 Combined forms of age-related cataract, right eye: Secondary | ICD-10-CM

## 2021-11-03 DIAGNOSIS — R69 Illness, unspecified: Secondary | ICD-10-CM | POA: Diagnosis not present

## 2021-11-03 DIAGNOSIS — Z87891 Personal history of nicotine dependence: Secondary | ICD-10-CM

## 2021-11-03 DIAGNOSIS — H5711 Ocular pain, right eye: Secondary | ICD-10-CM | POA: Diagnosis not present

## 2021-11-03 DIAGNOSIS — H01005 Unspecified blepharitis left lower eyelid: Secondary | ICD-10-CM | POA: Diagnosis not present

## 2021-11-03 DIAGNOSIS — F418 Other specified anxiety disorders: Secondary | ICD-10-CM | POA: Diagnosis not present

## 2021-11-03 SURGERY — CATARACT EXTRACTION PHACO AND INTRAOCULAR LENS PLACEMENT (IOC) with placement of Corticosteroid
Anesthesia: Monitor Anesthesia Care | Site: Eye | Laterality: Right

## 2021-11-03 MED ORDER — DEXAMETHASONE 0.4 MG OP INST
VAGINAL_INSERT | OPHTHALMIC | Status: AC
  Filled 2021-11-03: qty 1

## 2021-11-03 MED ORDER — TROPICAMIDE 1 % OP SOLN
1.0000 [drp] | OPHTHALMIC | Status: AC | PRN
  Administered 2021-11-03 (×3): 1 [drp] via OPHTHALMIC

## 2021-11-03 MED ORDER — CHLORHEXIDINE GLUCONATE 0.12 % MT SOLN: 15.0000 mL | Freq: Once | OROMUCOSAL | Status: DC

## 2021-11-03 MED ORDER — BSS IO SOLN
INTRAOCULAR | Status: DC | PRN
  Administered 2021-11-03: 15 mL via INTRAOCULAR

## 2021-11-03 MED ORDER — SODIUM HYALURONATE 10 MG/ML IO SOLUTION
PREFILLED_SYRINGE | INTRAOCULAR | Status: DC | PRN
  Administered 2021-11-03: 0.85 mL via INTRAOCULAR

## 2021-11-03 MED ORDER — PHENYLEPHRINE-KETOROLAC 1-0.3 % IO SOLN
INTRAOCULAR | Status: DC | PRN
  Administered 2021-11-03: 500 mL via OPHTHALMIC

## 2021-11-03 MED ORDER — POVIDONE-IODINE 5 % OP SOLN
OPHTHALMIC | Status: DC | PRN
  Administered 2021-11-03: 1 via OPHTHALMIC

## 2021-11-03 MED ORDER — LIDOCAINE HCL (PF) 1 % IJ SOLN
INTRAOCULAR | Status: DC | PRN
  Administered 2021-11-03: 1 mL via OPHTHALMIC

## 2021-11-03 MED ORDER — DEXAMETHASONE 0.4 MG OP INST
VAGINAL_INSERT | OPHTHALMIC | Status: DC | PRN
  Administered 2021-11-03: 0.4 mg via OPHTHALMIC

## 2021-11-03 MED ORDER — PHENYLEPHRINE-KETOROLAC 1-0.3 % IO SOLN
INTRAOCULAR | Status: AC
  Filled 2021-11-03: qty 4

## 2021-11-03 MED ORDER — PHENYLEPHRINE HCL 2.5 % OP SOLN
1.0000 [drp] | OPHTHALMIC | Status: AC | PRN
  Administered 2021-11-03 (×3): 1 [drp] via OPHTHALMIC

## 2021-11-03 MED ORDER — TETRACAINE HCL 0.5 % OP SOLN
1.0000 [drp] | OPHTHALMIC | Status: AC | PRN
Start: 2021-11-03 — End: 2021-11-03
  Administered 2021-11-03 (×3): 1 [drp] via OPHTHALMIC

## 2021-11-03 MED ORDER — SODIUM CHLORIDE 0.9% FLUSH
INTRAVENOUS | Status: DC | PRN
  Administered 2021-11-03: 3 mL via INTRAVENOUS

## 2021-11-03 MED ORDER — SODIUM HYALURONATE 23MG/ML IO SOSY
PREFILLED_SYRINGE | INTRAOCULAR | Status: DC | PRN
  Administered 2021-11-03: 0.6 mL via INTRAOCULAR

## 2021-11-03 MED ORDER — MIDAZOLAM HCL 2 MG/2ML IJ SOLN
INTRAMUSCULAR | Status: AC
  Filled 2021-11-03: qty 2

## 2021-11-03 MED ORDER — LIDOCAINE HCL 3.5 % OP GEL
1.0000 | Freq: Once | OPHTHALMIC | Status: AC
  Administered 2021-11-03: 1 via OPHTHALMIC

## 2021-11-03 MED ORDER — MIDAZOLAM HCL 2 MG/2ML IJ SOLN
INTRAMUSCULAR | Status: DC | PRN
  Administered 2021-11-03: 2 mg via INTRAVENOUS

## 2021-11-03 MED ORDER — EPINEPHRINE PF 1 MG/ML IJ SOLN
INTRAMUSCULAR | Status: AC
  Filled 2021-11-03: qty 2

## 2021-11-03 MED ORDER — ORAL CARE MOUTH RINSE: 15.0000 mL | Freq: Once | OROMUCOSAL | Status: DC

## 2021-11-03 MED ORDER — STERILE WATER FOR IRRIGATION IR SOLN
Status: DC | PRN
  Administered 2021-11-03: 250 mL

## 2021-11-03 SURGICAL SUPPLY — 14 items
CATARACT SUITE SIGHTPATH (MISCELLANEOUS) ×1 IMPLANT
CLOTH BEACON ORANGE TIMEOUT ST (SAFETY) ×1 IMPLANT
EYE SHIELD UNIVERSAL CLEAR (GAUZE/BANDAGES/DRESSINGS) IMPLANT
FEE CATARACT SUITE SIGHTPATH (MISCELLANEOUS) ×1 IMPLANT
GLOVE BIOGEL PI IND STRL 7.0 (GLOVE) ×2 IMPLANT
GLOVE BIOGEL PI INDICATOR 7.0 (GLOVE) ×1
GLOVE SURG SS PI 7.0 STRL IVOR (GLOVE) IMPLANT
LENS IOL RAYNER 20.5 (Intraocular Lens) ×1 IMPLANT
LENS IOL RAYONE EMV 20.5 (Intraocular Lens) IMPLANT
PAD ARMBOARD 7.5X6 YLW CONV (MISCELLANEOUS) ×1 IMPLANT
SYR TB 1ML LL NO SAFETY (SYRINGE) ×1 IMPLANT
TAPE SURG TRANSPORE 1 IN (GAUZE/BANDAGES/DRESSINGS) IMPLANT
TAPE SURGICAL TRANSPORE 1 IN (GAUZE/BANDAGES/DRESSINGS) ×1
WATER STERILE IRR 250ML POUR (IV SOLUTION) ×1 IMPLANT

## 2021-11-03 NOTE — Transfer of Care (Signed)
Immediate Anesthesia Transfer of Care Note  Patient: Cynthia Brewer  Procedure(s) Performed: CATARACT EXTRACTION PHACO AND INTRAOCULAR LENS PLACEMENT (IOC) with placement of Corticosteroid (Right: Eye)  Patient Location: Short Stay  Anesthesia Type:MAC  Level of Consciousness: awake and patient cooperative  Airway & Oxygen Therapy: Patient Spontanous Breathing  Post-op Assessment: Report given to RN and Post -op Vital signs reviewed and stable  Post vital signs: Reviewed and stable  Last Vitals:  Vitals Value Taken Time  BP 123/73 1048  Temp 97.3 1048  Pulse 66 1048  Resp 18 1048  SpO2 98 1048    Last Pain:  Vitals:   11/03/21 0942  TempSrc: Oral  PainSc:       Patients Stated Pain Goal: 4 (40/34/74 2595)  Complications: No notable events documented.

## 2021-11-03 NOTE — Discharge Instructions (Addendum)
Please discharge patient when stable, will follow up today with Dr. Wrzosek at the [facility] office immediately following discharge.  Leave shield in place until visit.  All paperwork with discharge instructions will be given at the office.  [facility] Address:  [address]  [place], [place] [address]  

## 2021-11-03 NOTE — Op Note (Signed)
Date of procedure: 11/03/21  Pre-operative diagnosis:  Visually significant combined form age-related cataract, Right Eye (H25.811)  Post-operative diagnosis:   1. Visually significant combined form age-related cataract, Right Eye (H25.811) 2. Pain and inflammation following cataract surgery Right Eye (H57.11)  Procedure:  Removal of cataract via phacoemulsification and insertion of intra-ocular lens Rayner [id number] +20.5D into the capsular bag of the Right Eye 2. Placement of Dextenza insert, Right Eye  Attending surgeon: Gerda Diss. Tasmin Exantus, MD, MA  Anesthesia: MAC, Topical [place]  Complications: None  Estimated Blood Loss: <56m (minimal)  Specimens: None  Implants: As above  Indications:  Visually significant age-related cataract, Right Eye  Procedure:  The patient was seen and identified in the pre-operative area. The operative eye was identified and dilated.  The operative eye was marked.  Topical anesthesia was administered to the operative eye.     The patient was then to the operative suite and placed in the supine position.  A timeout was performed confirming the patient, procedure to be performed, and all other relevant information.   The patient's face was prepped and draped in the usual fashion for intra-ocular surgery.  A lid speculum was placed into the operative eye and the surgical microscope moved into place and focused.  A superotemporal paracentesis was created using a 20 gauge paracentesis blade.  Shugarcaine was injected into the anterior chamber.  Viscoelastic was injected into the anterior chamber.  A temporal clear-corneal main wound incision was created using a 2.418mmicrokeratome.  A continuous curvilinear capsulorrhexis was initiated using an irrigating cystitome and completed using capsulorrhexis forceps.  Hydrodissection and hydrodeliniation were performed.  Viscoelastic was injected into the anterior chamber.  A phacoemulsification handpiece and a chopper as a  second instrument were used to remove the nucleus and epinucleus. The irrigation/aspiration handpiece was used to remove any remaining cortical material.   The capsular bag was reinflated with viscoelastic, checked, and found to be intact.  The intraocular lens was inserted into the capsular bag.  The irrigation/aspiration handpiece was used to remove any remaining viscoelastic.  The clear corneal wound and paracentesis wounds were then hydrated and checked with Weck-Cels to be watertight.    The lid-speculum was removed. The lower punctum was dilated. A Dextenza implant was placed in the lower canaliculus without complication.  The drape was removed.  The patient's face was cleaned with a wet and dry 4x4. A clear shield was taped over the eye. The patient was taken to the post-operative care unit in good condition, having tolerated the procedure well.  Post-Op Instructions: [facility] and will receive all other orders and instructions.

## 2021-11-03 NOTE — Anesthesia Procedure Notes (Signed)
Date/Time: 11/03/2021 10:24 AM  Performed by: Vista Deck, CRNAPre-anesthesia Checklist: Patient identified, Emergency Drugs available, Suction available, Timeout performed and Patient being monitored Patient Re-evaluated:Patient Re-evaluated prior to induction Oxygen Delivery Method: Nasal Cannula

## 2021-11-03 NOTE — Anesthesia Preprocedure Evaluation (Signed)
Anesthesia Evaluation  Patient identified by MRN, date of birth, ID band Patient awake    Reviewed: Allergy & Precautions, NPO status , Patient's Chart, lab work & pertinent test results  Airway Mallampati: III  TM Distance: >3 FB Neck ROM: Full   Comment: Cervical spine sx Dental  (+) Upper Dentures, Dental Advisory Given   Pulmonary asthma , sleep apnea and Continuous Positive Airway Pressure Ventilation , COPD, former smoker,    Pulmonary exam normal breath sounds clear to auscultation       Cardiovascular Exercise Tolerance: Good hypertension, Pt. on medications + angina + DOE  Normal cardiovascular exam Rhythm:Regular Rate:Normal     Neuro/Psych PSYCHIATRIC DISORDERS Anxiety Depression negative neurological ROS     GI/Hepatic Neg liver ROS, GERD  Medicated and Controlled,  Endo/Other  negative endocrine ROS  Renal/GU negative Renal ROS  negative genitourinary   Musculoskeletal  (+) Arthritis ,   Abdominal   Peds negative pediatric ROS (+)  Hematology negative hematology ROS (+)   Anesthesia Other Findings   Reproductive/Obstetrics negative OB ROS                             Anesthesia Physical  Anesthesia Plan  ASA: 2  Anesthesia Plan: MAC   Post-op Pain Management: Minimal or no pain anticipated   Induction:   PONV Risk Score and Plan:   Airway Management Planned: Natural Airway and Nasal Cannula  Additional Equipment:   Intra-op Plan:   Post-operative Plan:   Informed Consent: I have reviewed the patients History and Physical, chart, labs and discussed the procedure including the risks, benefits and alternatives for the proposed anesthesia with the patient or authorized representative who has indicated his/her understanding and acceptance.     Dental advisory given  Plan Discussed with: CRNA and Surgeon  Anesthesia Plan Comments:         Anesthesia  Quick Evaluation  

## 2021-11-03 NOTE — Interval H&P Note (Signed)
History and Physical Interval Note:  11/03/2021 10:12 AM  Cynthia Brewer  has presented today for surgery, with the diagnosis of combined forms age related cataract; right ocular pain and inflammation; right.  The various methods of treatment have been discussed with the patient and family. After consideration of risks, benefits and other options for treatment, the patient has consented to  Procedure(s) with comments: CATARACT EXTRACTION PHACO AND INTRAOCULAR LENS PLACEMENT ([place]) with placement of Corticosteroid (Right) - CDE:  as a surgical intervention.  The patient's history has been reviewed, patient examined, no change in status, stable for surgery.  I have reviewed the patient's chart and labs.  Questions were answered to the patient's satisfaction.     Baruch Goldmann

## 2021-11-03 NOTE — Anesthesia Postprocedure Evaluation (Signed)
Anesthesia Post Note  Patient: Cynthia Brewer  Procedure(s) Performed: CATARACT EXTRACTION PHACO AND INTRAOCULAR LENS PLACEMENT ([facility]) with placement of Corticosteroid (Right: Eye)  Patient location during evaluation: Phase II Anesthesia Type: MAC Level of consciousness: awake and alert and oriented Pain management: pain level controlled Vital Signs Assessment: post-procedure vital signs reviewed and stable Respiratory status: spontaneous breathing, nonlabored ventilation and respiratory function stable Cardiovascular status: blood pressure returned to baseline and stable Postop Assessment: no apparent nausea or vomiting Anesthetic complications: no   No notable events documented.   Last Vitals:  Vitals:   11/03/21 0942 11/03/21 1046  BP:  123/64  Pulse: 71 73  Resp:  15  Temp: 36.6 C 36.8 C  SpO2:  98%    Last Pain:  Vitals:   11/03/21 1046  TempSrc: Oral  PainSc:                  Ronit Cranfield C Nichalos Brenton

## 2021-11-13 ENCOUNTER — Encounter (HOSPITAL_COMMUNITY)
Admission: RE | Admit: 2021-11-13 | Discharge: 2021-11-13 | Disposition: A | Discharge status: Home / Self Care | Payer: Medicare HMO | Source: Ambulatory Visit | Attending: Ophthalmology | Admitting: Ophthalmology

## 2021-11-13 DIAGNOSIS — M47814 Spondylosis without myelopathy or radiculopathy, thoracic region: Secondary | ICD-10-CM | POA: Diagnosis not present

## 2021-11-13 DIAGNOSIS — J449 Chronic obstructive pulmonary disease, unspecified: Secondary | ICD-10-CM | POA: Diagnosis not present

## 2021-11-13 DIAGNOSIS — E119 Type 2 diabetes mellitus without complications: Secondary | ICD-10-CM | POA: Diagnosis not present

## 2021-11-13 DIAGNOSIS — Z8611 Personal history of tuberculosis: Secondary | ICD-10-CM | POA: Diagnosis not present

## 2021-11-13 DIAGNOSIS — Z90710 Acquired absence of both cervix and uterus: Secondary | ICD-10-CM | POA: Diagnosis not present

## 2021-11-13 DIAGNOSIS — I10 Essential (primary) hypertension: Secondary | ICD-10-CM | POA: Diagnosis not present

## 2021-11-13 DIAGNOSIS — U071 COVID-19: Secondary | ICD-10-CM | POA: Diagnosis not present

## 2021-11-13 DIAGNOSIS — J9811 Atelectasis: Secondary | ICD-10-CM | POA: Diagnosis not present

## 2021-11-13 DIAGNOSIS — R0600 Dyspnea, unspecified: Secondary | ICD-10-CM | POA: Diagnosis not present

## 2021-11-13 DIAGNOSIS — Z87891 Personal history of nicotine dependence: Secondary | ICD-10-CM | POA: Diagnosis not present

## 2021-11-13 DIAGNOSIS — R0981 Nasal congestion: Secondary | ICD-10-CM | POA: Diagnosis not present

## 2021-11-13 DIAGNOSIS — R519 Headache, unspecified: Secondary | ICD-10-CM | POA: Diagnosis not present

## 2021-11-13 DIAGNOSIS — R059 Cough, unspecified: Secondary | ICD-10-CM | POA: Diagnosis not present

## 2021-11-13 NOTE — Pre-Procedure Instructions (Signed)
Called for pre-op phone call. Patient states she has had COVID and day 10 of COVID will be on 9/1 which is also her surgery date. Dr Marisa Hua wants his patients to be 6 weeks post COVID befor he does their surgery. I called Mackie Pai at the [facility] eye center and she will get patient rescheduled. I called patient to let her know above information and she verbalizes understanding that Butch Penny will be calling her to reschedule surgery.

## 2021-11-22 DIAGNOSIS — R059 Cough, unspecified: Secondary | ICD-10-CM | POA: Diagnosis not present

## 2021-11-22 DIAGNOSIS — R0981 Nasal congestion: Secondary | ICD-10-CM | POA: Diagnosis not present

## 2021-11-22 DIAGNOSIS — I10 Essential (primary) hypertension: Secondary | ICD-10-CM | POA: Diagnosis not present

## 2021-11-22 DIAGNOSIS — U099 Post covid-19 condition, unspecified: Secondary | ICD-10-CM | POA: Diagnosis not present

## 2021-11-29 DIAGNOSIS — Z23 Encounter for immunization: Secondary | ICD-10-CM | POA: Diagnosis not present

## 2021-11-29 DIAGNOSIS — E7849 Other hyperlipidemia: Secondary | ICD-10-CM | POA: Diagnosis not present

## 2021-11-29 DIAGNOSIS — F32A Depression, unspecified: Secondary | ICD-10-CM | POA: Diagnosis not present

## 2021-11-29 DIAGNOSIS — R69 Illness, unspecified: Secondary | ICD-10-CM | POA: Diagnosis not present

## 2021-11-29 DIAGNOSIS — K219 Gastro-esophageal reflux disease without esophagitis: Secondary | ICD-10-CM | POA: Diagnosis not present

## 2021-11-29 DIAGNOSIS — B351 Tinea unguium: Secondary | ICD-10-CM | POA: Diagnosis not present

## 2021-11-29 DIAGNOSIS — I10 Essential (primary) hypertension: Secondary | ICD-10-CM | POA: Diagnosis not present

## 2021-11-29 DIAGNOSIS — E782 Mixed hyperlipidemia: Secondary | ICD-10-CM | POA: Diagnosis not present

## 2021-11-29 DIAGNOSIS — J449 Chronic obstructive pulmonary disease, unspecified: Secondary | ICD-10-CM | POA: Diagnosis not present

## 2021-11-29 DIAGNOSIS — R739 Hyperglycemia, unspecified: Secondary | ICD-10-CM | POA: Diagnosis not present

## 2021-11-29 DIAGNOSIS — Z6830 Body mass index (BMI) 30.0-30.9, adult: Secondary | ICD-10-CM | POA: Diagnosis not present

## 2021-12-16 DIAGNOSIS — I10 Essential (primary) hypertension: Secondary | ICD-10-CM | POA: Diagnosis not present

## 2021-12-16 DIAGNOSIS — J449 Chronic obstructive pulmonary disease, unspecified: Secondary | ICD-10-CM | POA: Diagnosis not present

## 2021-12-16 DIAGNOSIS — E782 Mixed hyperlipidemia: Secondary | ICD-10-CM | POA: Diagnosis not present

## 2021-12-16 DIAGNOSIS — K219 Gastro-esophageal reflux disease without esophagitis: Secondary | ICD-10-CM | POA: Diagnosis not present

## 2021-12-28 DIAGNOSIS — R739 Hyperglycemia, unspecified: Secondary | ICD-10-CM | POA: Diagnosis not present

## 2022-01-01 ENCOUNTER — Encounter (HOSPITAL_COMMUNITY)
Admission: RE | Admit: 2022-01-01 | Discharge: 2022-01-01 | Disposition: A | Discharge status: Home / Self Care | Payer: Medicare HMO | Source: Ambulatory Visit | Attending: Ophthalmology | Admitting: Ophthalmology

## 2022-01-01 DIAGNOSIS — H25812 Combined forms of age-related cataract, left eye: Secondary | ICD-10-CM | POA: Diagnosis not present

## 2022-01-02 NOTE — H&P (Signed)
Surgical History & Physical  Patient Name: Cynthia Brewer DOB: Jul 24, 1950  Surgery: Cataract extraction with intraocular lens implant phacoemulsification; Left Eye  Surgeon: Baruch Goldmann MD Surgery Date:  01-08-22 Pre-Op Date:  12-28-21  HPI: A 31 Yr. old female patient present for 6 day post op OD, pre op OS sx 11/17/2021. 1. The patient complains of difficulty when driving at night, which began many years ago. The left eye is affected. The episode is constant. The patient describes glare symptoms affecting their eyes/vision. Symptoms occur when the patient is driving. 2. Right eye is doing well since sx. She has a small bump that has come up on her RLL x2 days ago. She can feel it rubbing her eye. Using Combo drop TID OD. HPI was performed by Baruch Goldmann .  Medical History: Cataracts Glaucoma High Blood Pressure Thyroid Problems  Review of Systems Negative Allergic/Immunologic Negative Cardiovascular Negative Constitutional Negative Ear, Nose, Mouth & Throat Negative Endocrine Negative Eyes Negative Gastrointestinal Negative Genitourinary Negative Hemotologic/Lymphatic Negative Integumentary Negative Musculoskeletal Negative Neurological Negative Psychiatry Negative Respiratory  Social   Never smoked   Medication Prednisolone-Moxifloxacin-Bromfenac,  Apiprazol, Carvedilol, Omeprazole, Magnesium, Fluoxetine, HCTZ, Klor-Con, Losartan, Pravastatin,   Sx/Procedures Phaco c IOL OD with Dextenza,  Cyst on neck removal, Neck sx, Hernia Sx,   Drug Allergies  Peniclline,   History & Physical: Heent: Cataract, left eye NECK: supple without bruits LUNGS: lungs clear to auscultation CV: regular rate and rhythm Abdomen: soft and non-tender Impression & Plan: Assessment: 1.  CATARACT EXTRACTION STATUS; Right Eye (Z98.41) 2.  COMBINED FORMS AGE RELATED CATARACT; Left Eye (H25.812) 3.  Myopia ; Right Eye (H52.11) 4.  NUCLEAR SCLEROSIS AGE RELATED; Left Eye (H25.12) 5.   ARCUS SENILIS; Both Eyes (H18.413)  Plan: 1.  1 week after cataract surgery. Doing well with improved vision and normal eye pressure. Call with any problems or concerns. Stop drops - Dextenza. Call with any concerning symptoms.  2.  Cataract accounts for the patient's decreased vision. This visual impairment is not correctable with a tolerable change in glasses or contact lenses. Cataract surgery with an implantation of a new lens should significantly improve the visual and functional status of the patient. Discussed all risks, benefits, alternatives, and potential complications. Discussed the procedures and recovery. Patient desires to have surgery. A-scan ordered and performed today for intra-ocular lens calculations. The surgery will be performed in order to improve vision for driving, reading, and for eye examinations. Recommend phacoemulsification with intra-ocular lens. Recommend Dextenza for post-operative pain and inflammation. Left Eye. Surgery required to correct imbalance of vision. Dilates poorly - shugarcaine by protocol. Omidira.  3.   4.   5.

## 2022-01-08 ENCOUNTER — Ambulatory Visit (HOSPITAL_COMMUNITY)
Admission: RE | Admit: 2022-01-08 | Discharge: 2022-01-08 | Disposition: A | Discharge status: Home / Self Care | Payer: Medicare HMO | Attending: Ophthalmology | Admitting: Ophthalmology

## 2022-01-08 ENCOUNTER — Encounter (HOSPITAL_COMMUNITY)
Admission: RE | Disposition: A | Discharge status: Home / Self Care | Payer: Self-pay | Source: Home / Self Care | Attending: Ophthalmology

## 2022-01-08 ENCOUNTER — Ambulatory Visit (HOSPITAL_COMMUNITY): Payer: Medicare HMO | Admitting: Anesthesiology

## 2022-01-08 ENCOUNTER — Ambulatory Visit (HOSPITAL_BASED_OUTPATIENT_CLINIC_OR_DEPARTMENT_OTHER): Payer: Medicare HMO | Admitting: Anesthesiology

## 2022-01-08 DIAGNOSIS — H25812 Combined forms of age-related cataract, left eye: Secondary | ICD-10-CM | POA: Insufficient documentation

## 2022-01-08 DIAGNOSIS — J449 Chronic obstructive pulmonary disease, unspecified: Secondary | ICD-10-CM | POA: Diagnosis not present

## 2022-01-08 DIAGNOSIS — I10 Essential (primary) hypertension: Secondary | ICD-10-CM | POA: Diagnosis not present

## 2022-01-08 DIAGNOSIS — Z9841 Cataract extraction status, right eye: Secondary | ICD-10-CM | POA: Diagnosis not present

## 2022-01-08 DIAGNOSIS — H18413 Arcus senilis, bilateral: Secondary | ICD-10-CM | POA: Insufficient documentation

## 2022-01-08 DIAGNOSIS — H5712 Ocular pain, left eye: Secondary | ICD-10-CM | POA: Insufficient documentation

## 2022-01-08 DIAGNOSIS — Z87891 Personal history of nicotine dependence: Secondary | ICD-10-CM | POA: Diagnosis not present

## 2022-01-08 SURGERY — CATARACT EXTRACTION PHACO AND INTRAOCULAR LENS PLACEMENT (IOC) with placement of Corticosteroid
Anesthesia: Monitor Anesthesia Care | Site: Eye | Laterality: Left

## 2022-01-08 MED ORDER — SODIUM HYALURONATE 10 MG/ML IO SOLUTION
PREFILLED_SYRINGE | INTRAOCULAR | Status: DC | PRN
  Administered 2022-01-08: 0.85 mL via INTRAOCULAR

## 2022-01-08 MED ORDER — MOXIFLOXACIN HCL 0.5 % OP SOLN
OPHTHALMIC | Status: DC | PRN
  Administered 2022-01-08: 0.2 mL via OPHTHALMIC

## 2022-01-08 MED ORDER — LIDOCAINE HCL 3.5 % OP GEL
1.0000 | Freq: Once | OPHTHALMIC | Status: AC
  Administered 2022-01-08: 1 via OPHTHALMIC

## 2022-01-08 MED ORDER — MIDAZOLAM HCL 2 MG/2ML IJ SOLN
INTRAMUSCULAR | Status: AC
  Filled 2022-01-08: qty 2

## 2022-01-08 MED ORDER — SODIUM HYALURONATE 23MG/ML IO SOSY
PREFILLED_SYRINGE | INTRAOCULAR | Status: DC | PRN
  Administered 2022-01-08: 0.6 mL via INTRAOCULAR

## 2022-01-08 MED ORDER — DEXAMETHASONE 0.4 MG OP INST
VAGINAL_INSERT | OPHTHALMIC | Status: DC | PRN
  Administered 2022-01-08: 0.4 mg via OPHTHALMIC

## 2022-01-08 MED ORDER — LIDOCAINE HCL (PF) 1 % IJ SOLN
INTRAOCULAR | Status: DC | PRN
  Administered 2022-01-08: 1 mL via OPHTHALMIC

## 2022-01-08 MED ORDER — BSS IO SOLN
INTRAOCULAR | Status: DC | PRN
  Administered 2022-01-08: 15 mL via INTRAOCULAR

## 2022-01-08 MED ORDER — DEXAMETHASONE 0.4 MG OP INST
VAGINAL_INSERT | OPHTHALMIC | Status: AC
  Filled 2022-01-08: qty 1

## 2022-01-08 MED ORDER — POVIDONE-IODINE 5 % OP SOLN
OPHTHALMIC | Status: DC | PRN
  Administered 2022-01-08: 1 via OPHTHALMIC

## 2022-01-08 MED ORDER — EPINEPHRINE PF 1 MG/ML IJ SOLN
INTRAOCULAR | Status: DC | PRN
  Administered 2022-01-08: 500 mL

## 2022-01-08 MED ORDER — TETRACAINE HCL 0.5 % OP SOLN
1.0000 [drp] | OPHTHALMIC | Status: AC | PRN
  Administered 2022-01-08 (×3): 1 [drp] via OPHTHALMIC

## 2022-01-08 MED ORDER — MOXIFLOXACIN HCL 5 MG/ML IO SOLN
INTRAOCULAR | Status: AC
  Filled 2022-01-08: qty 1

## 2022-01-08 MED ORDER — PHENYLEPHRINE HCL 2.5 % OP SOLN
1.0000 [drp] | OPHTHALMIC | Status: AC | PRN
  Administered 2022-01-08 (×3): 1 [drp] via OPHTHALMIC

## 2022-01-08 MED ORDER — MIDAZOLAM HCL 2 MG/2ML IJ SOLN
INTRAMUSCULAR | Status: DC | PRN
  Administered 2022-01-08: 1 mg via INTRAVENOUS

## 2022-01-08 MED ORDER — STERILE WATER FOR IRRIGATION IR SOLN
Status: DC | PRN
  Administered 2022-01-08: 250 mL

## 2022-01-08 MED ORDER — SODIUM CHLORIDE 0.9% FLUSH
INTRAVENOUS | Status: DC | PRN
  Administered 2022-01-08: 5 mL via INTRAVENOUS

## 2022-01-08 MED ORDER — EPINEPHRINE PF 1 MG/ML IJ SOLN
INTRAMUSCULAR | Status: AC
  Filled 2022-01-08: qty 1

## 2022-01-08 MED ORDER — TROPICAMIDE 1 % OP SOLN
1.0000 [drp] | OPHTHALMIC | Status: AC | PRN
  Administered 2022-01-08 (×3): 1 [drp] via OPHTHALMIC

## 2022-01-08 SURGICAL SUPPLY — 16 items
CATARACT SUITE SIGHTPATH (MISCELLANEOUS) ×1 IMPLANT
CLOTH BEACON ORANGE TIMEOUT ST (SAFETY) ×1 IMPLANT
DRAPE HALF SHEET 40X57 (DRAPES) IMPLANT
EYE SHIELD UNIVERSAL CLEAR (GAUZE/BANDAGES/DRESSINGS) IMPLANT
FEE CATARACT SUITE SIGHTPATH (MISCELLANEOUS) ×1 IMPLANT
GLOVE BIOGEL PI IND STRL 7.0 (GLOVE) ×2 IMPLANT
GLOVE SS BIOGEL STRL SZ 6.5 (GLOVE) IMPLANT
LENS IOL RAYNER 20.0 (Intraocular Lens) ×1 IMPLANT
LENS IOL RAYONE EMV 20.0 (Intraocular Lens) IMPLANT
NDL HYPO 18GX1.5 BLUNT FILL (NEEDLE) ×1 IMPLANT
NEEDLE HYPO 18GX1.5 BLUNT FILL (NEEDLE) ×1 IMPLANT
PAD ARMBOARD 7.5X6 YLW CONV (MISCELLANEOUS) ×1 IMPLANT
SYR TB 1ML LL NO SAFETY (SYRINGE) ×1 IMPLANT
TAPE SURG TRANSPORE 1 IN (GAUZE/BANDAGES/DRESSINGS) IMPLANT
TAPE SURGICAL TRANSPORE 1 IN (GAUZE/BANDAGES/DRESSINGS) ×1
WATER STERILE IRR 250ML POUR (IV SOLUTION) ×1 IMPLANT

## 2022-01-08 NOTE — Anesthesia Preprocedure Evaluation (Signed)
Anesthesia Evaluation  Patient identified by MRN, date of birth, ID band Patient awake    Reviewed: Allergy & Precautions, NPO status , Patient's Chart, lab work & pertinent test results  Airway Mallampati: III  TM Distance: >3 FB Neck ROM: Full   Comment: Cervical spine sx Dental  (+) Upper Dentures, Dental Advisory Given   Pulmonary asthma , sleep apnea and Continuous Positive Airway Pressure Ventilation , COPD, former smoker,    Pulmonary exam normal breath sounds clear to auscultation       Cardiovascular Exercise Tolerance: Good hypertension, Pt. on medications + angina + DOE  Normal cardiovascular exam Rhythm:Regular Rate:Normal     Neuro/Psych PSYCHIATRIC DISORDERS Anxiety Depression negative neurological ROS     GI/Hepatic Neg liver ROS, GERD  Medicated and Controlled,  Endo/Other  negative endocrine ROS  Renal/GU negative Renal ROS  negative genitourinary   Musculoskeletal  (+) Arthritis ,   Abdominal   Peds negative pediatric ROS (+)  Hematology negative hematology ROS (+)   Anesthesia Other Findings   Reproductive/Obstetrics negative OB ROS                             Anesthesia Physical  Anesthesia Plan  ASA: 2  Anesthesia Plan: MAC   Post-op Pain Management: Minimal or no pain anticipated   Induction:   PONV Risk Score and Plan:   Airway Management Planned: Natural Airway and Nasal Cannula  Additional Equipment:   Intra-op Plan:   Post-operative Plan:   Informed Consent: I have reviewed the patients History and Physical, chart, labs and discussed the procedure including the risks, benefits and alternatives for the proposed anesthesia with the patient or authorized representative who has indicated his/her understanding and acceptance.     Dental advisory given  Plan Discussed with: CRNA and Surgeon  Anesthesia Plan Comments:         Anesthesia  Quick Evaluation

## 2022-01-08 NOTE — Interval H&P Note (Signed)
History and Physical Interval Note:  01/08/2022 12:36 PM  Cynthia Brewer  has presented today for surgery, with the diagnosis of combined forms age related cataract; left ocular pain and inflammation; left.  The various methods of treatment have been discussed with the patient and family. After consideration of risks, benefits and other options for treatment, the patient has consented to  Procedure(s) with comments: CATARACT EXTRACTION PHACO AND INTRAOCULAR LENS PLACEMENT ([place]) with placement of Corticosteroid (Left) - CDE as a surgical intervention.  The patient's history has been reviewed, patient examined, no change in status, stable for surgery.  I have reviewed the patient's chart and labs.  Questions were answered to the patient's satisfaction.     Baruch Goldmann

## 2022-01-08 NOTE — Transfer of Care (Signed)
Immediate Anesthesia Transfer of Care Note  Patient: Cynthia Brewer  Procedure(s) Performed: CATARACT EXTRACTION PHACO AND INTRAOCULAR LENS PLACEMENT ([facility]) with placement of Corticosteroid (Left: Eye)  Patient Location: PACU and Short Stay  Anesthesia Type:MAC  Level of Consciousness: awake, alert , oriented and patient cooperative  Airway & Oxygen Therapy: Patient Spontanous Breathing  Post-op Assessment: Report given to RN, Post -op Vital signs reviewed and stable and Patient moving all extremities X 4  Post vital signs: Reviewed and stable  Last Vitals:  Vitals Value Taken Time  BP    Temp    Pulse    Resp    SpO2      Last Pain:  Vitals:   01/08/22 1122  TempSrc: Oral  PainSc: 0-No pain      Patients Stated Pain Goal: 6 (88/41/66 0630)  Complications: No notable events documented.

## 2022-01-08 NOTE — Op Note (Signed)
Date of procedure: 01/08/22  Pre-operative diagnosis: Visually significant age-related combined cataract, Left Eye (H25.812)  Post-operative diagnosis:  Visually significant age-related combined cataract, Left Eye (H25.812) 2.   Pain and inflammation following cataract surgery, Left Eye (H57.12)  Procedure:  Removal of cataract via phacoemulsification and insertion of intra-ocular lens Rayner [id number] +20.0D into the capsular bag of the Left Eye 2. Placement of Dextenza Implant, Left Lower Lid  Attending surgeon: Gerda Diss. Nikkolas Coomes, MD, MA  Anesthesia: MAC, Topical [place]  Complications: None  Estimated Blood Loss: <73m (minimal)  Specimens: None  Implants: As above  Indications:  Visually significant age-related cataract, Left Eye  Procedure:  The patient was seen and identified in the pre-operative area. The operative eye was identified and dilated.  The operative eye was marked.  Topical anesthesia was administered to the operative eye.     The patient was then to the operative suite and placed in the supine position.  A timeout was performed confirming the patient, procedure to be performed, and all other relevant information.   The patient's face was prepped and draped in the usual fashion for intra-ocular surgery.  A lid speculum was placed into the operative eye and the surgical microscope moved into place and focused.  An inferotemporal paracentesis was created using a 20 gauge paracentesis blade.  Shugarcaine was injected into the anterior chamber.  Viscoelastic was injected into the anterior chamber.  A temporal clear-corneal main wound incision was created using a 2.439mmicrokeratome.  A continuous curvilinear capsulorrhexis was initiated using an irrigating cystitome and completed using capsulorrhexis forceps.  Hydrodissection and hydrodeliniation were performed.  Viscoelastic was injected into the anterior chamber.  A phacoemulsification handpiece and a chopper as a second  instrument were used to remove the nucleus and epinucleus. The irrigation/aspiration handpiece was used to remove any remaining cortical material.   The capsular bag was reinflated with viscoelastic, checked, and found to be intact.  The intraocular lens was inserted into the capsular bag.  The irrigation/aspiration handpiece was used to remove any remaining viscoelastic.  The clear corneal wound and paracentesis wounds were then hydrated and checked with Weck-Cels to be watertight.  0.41m58mf moxifloxacin was injected into the anterior chamber.  The lid-speculum was removed. The lower punctum was dilated. A Dextenza implant was placed in the lower canaliculus without complication.   The drape was removed.  The patient's face was cleaned with a wet and dry 4x4.   A clear shield was taped over the eye. The patient was taken to the post-operative care unit in good condition, having tolerated the procedure well.  Post-Op Instructions: The patient will follow up at [facility] a same day post-operative evaluation and will receive all other orders and instructions.

## 2022-01-08 NOTE — Discharge Instructions (Signed)
Please discharge patient when stable, will follow up today with Dr. Shirlette Scarber at the [facility] [place] office immediately following discharge.  Leave shield in place until visit.  All paperwork with discharge instructions will be given at the office.  [facility] Address:  [address]  [place],  [address]  

## 2022-01-10 NOTE — Anesthesia Postprocedure Evaluation (Signed)
Anesthesia Post Note  Patient: Cynthia Brewer  Procedure(s) Performed: CATARACT EXTRACTION PHACO AND INTRAOCULAR LENS PLACEMENT ([facility]) with placement of Corticosteroid (Left: Eye)  Patient location during evaluation: Phase II Anesthesia Type: MAC Level of consciousness: awake Pain management: pain level controlled Vital Signs Assessment: post-procedure vital signs reviewed and stable Respiratory status: spontaneous breathing and respiratory function stable Cardiovascular status: blood pressure returned to baseline and stable Postop Assessment: no headache and no apparent nausea or vomiting Anesthetic complications: no Comments: Late entry   No notable events documented.   Last Vitals:  Vitals:   01/08/22 1122 01/08/22 1306  BP:  (!) 154/69  Pulse: 71 66  Resp: 16 14  Temp: 36.8 C 36.7 C  SpO2: 97% 100%    Last Pain:  Vitals:   01/09/22 1516  TempSrc:   PainSc: 0-No pain                 Louann Sjogren

## 2022-01-26 DIAGNOSIS — R739 Hyperglycemia, unspecified: Secondary | ICD-10-CM | POA: Diagnosis not present

## 2022-02-15 DIAGNOSIS — B351 Tinea unguium: Secondary | ICD-10-CM | POA: Diagnosis not present

## 2022-02-15 DIAGNOSIS — J449 Chronic obstructive pulmonary disease, unspecified: Secondary | ICD-10-CM | POA: Diagnosis not present

## 2022-02-15 DIAGNOSIS — Z6831 Body mass index (BMI) 31.0-31.9, adult: Secondary | ICD-10-CM | POA: Diagnosis not present

## 2022-02-15 DIAGNOSIS — I10 Essential (primary) hypertension: Secondary | ICD-10-CM | POA: Diagnosis not present

## 2022-02-15 DIAGNOSIS — R1310 Dysphagia, unspecified: Secondary | ICD-10-CM | POA: Diagnosis not present

## 2022-03-26 NOTE — Progress Notes (Deleted)
Office Visit Note  Patient: Cynthia Brewer             Date of Birth: December 19, 1950           MRN: [id number]             PCP: Lanelle Bal, PA-C Referring: Curlene Labrum, MD Visit Date: 03/27/2022   Subjective:  No chief complaint on file.   History of Present Illness: Cynthia Brewer is a 72 y.o. female here for follow up for history of apparent seropositive RA previously on methotrexate but off treatment for the past year. She had undesirable amount of alopecia on the medication and symptoms did not particularly worsen after cessation..***   Previous HPI 03/24/21 Cynthia Brewer is a 72 y.o. female here for follow up for seropositive RA on methotrexate 15 mg PO weekly and folic acid 1 mg daily. Since our last visit she discontinued amlodipine with improvement in her leg edema and associated pain. Her arthritis is doing well overall and no recurrence of left knee swelling. She is concerned about worsening hair loss and wants to discontinue methotrexate because of this problem.   Previous HPI 12/07/20 Cynthia Brewer is a 72 y.o. female here for follow up for seropositive RA after starting methotrexate 15 mg PO weekly. Her start was slightly delayed due to probably viral URI symptoms. She was also referred for lymphedema therapy at recommendation of nursing evaluation.  Left knee pain continues to be the biggest problem for her.  Bilateral hand swelling and stiffness also ongoing for 30 minutes or less per day.  She has not yet seen [facility] for starting further lymphedema treatment sounds like discussed some type of lower extremity compression.   10/07/20 Cynthia Brewer is a 72 y.o. female here for inflammation of the left knee with positive ANA and rheumatoid factor. She reports knee pain and swelling started less than 1 year ago but does not recall an exact onset time. She does not recall any preceding events or medication changes associated with this.  For this she had some  joint pain and stiffness in her bilateral hands that has been ongoing for about 3 years.  She has not typically noticed swelling in her finger joints describes an occasional appearance of dark or red discoloration MCP joints and distally.  Knee aspiration with steroid injection was performed last month with 15 cc of fluid of fairly bland fluid no significant neutrophil elevation or crystals present.  She noticed a symptom benefit but relatively rapid return of symptoms.   No Rheumatology ROS completed.   PMFS History:  Patient Active Problem List   Diagnosis Date Noted   Vulvar itching 10/05/2021   Vaginal discharge 07/24/2021   Vaginal odor 07/24/2021   BV (bacterial vaginosis) 07/24/2021   High risk medication use 10/20/2020   Seropositive rheumatoid arthritis ([place]) 10/07/2020   Positive ANA (antinuclear antibody) 10/07/2020   Effusion, left knee 10/07/2020   Bilateral hand pain 10/07/2020   IBS (irritable bowel syndrome) 05/23/2020   Hematemesis 05/23/2020   OSA (obstructive sleep apnea) 11/12/2019   Essential hypertension 08/28/2019   Hyperlipidemia 08/28/2019   DOE (dyspnea on exertion) 08/28/2019   Atypical chest pain 08/28/2019   History of parotidectomy 10/27/2018   Rectal bleeding 08/21/2017   History of colon polyps 05/09/2017   Latent tuberculosis by skin test 01/03/2017   Chronic cough 01/03/2017   Asthma-COPD overlap syndrome 01/03/2017    Past Medical History:  Diagnosis Date  Anxiety    Arthritis    hands and knees   COPD (chronic obstructive pulmonary disease) (HCC)    Depression    GERD (gastroesophageal reflux disease)    Hypertension    Mass of right parotid gland     Family History  Problem Relation Age of Onset   Healthy Mother    Healthy Father    Depression Sister    Hypertension Sister    Sleep apnea Sister    Diabetes Sister    Hypertension Sister    Healthy Brother    Throat cancer Brother    Healthy Brother    Autism Brother     Healthy Daughter    Healthy Daughter    Healthy Son    Past Surgical History:  Procedure Laterality Date   ABDOMINAL HYSTERECTOMY     BIOPSY  06/01/2020   Procedure: BIOPSY;  Surgeon: Harvel Quale, MD;  [facility];  Service: Gastroenterology;;   BREAST SURGERY Bilateral    breast bx   CERVICAL SPINE SURGERY     ESOPHAGOGASTRODUODENOSCOPY (EGD) WITH PROPOFOL N/A 06/01/2020   Procedure: ESOPHAGOGASTRODUODENOSCOPY (EGD) WITH PROPOFOL;  Surgeon: Harvel Quale, MD;  Location: [facility];  Service: Gastroenterology;  Laterality: N/A;  AM   HERNIA REPAIR     UHR   PAROTIDECTOMY Right 10/27/2018   Procedure: RIGHT PAROTIDECTOMY;  Surgeon: Leta Baptist, MD;  Location: [place];  Service: ENT;  Laterality: Right;   POLYPECTOMY  06/01/2020   Procedure: POLYPECTOMY;  Surgeon: Harvel Quale, MD;  Location: [facility];  Service: Gastroenterology;;  duodenal nodule;    Social History   Social History Narrative   Not on file   Immunization History  Administered Date(s) Administered   Influenza, High Dose Seasonal PF 01/11/2018   Moderna Sars-Covid-2 Vaccination 05/15/2019, 06/12/2019, 01/25/2020, 07/29/2020   Pneumococcal Polysaccharide-23 01/12/2017     Objective: Vital Signs: There were no vitals taken for this visit.   Physical Exam   Musculoskeletal Exam: ***  CDAI Exam: CDAI Score: -- Patient Global: --; Provider Global: -- Swollen: --; Tender: -- Joint Exam 03/27/2022   No joint exam has been documented for this visit   There is currently no information documented on the homunculus. Go to the Rheumatology activity and complete the homunculus joint exam.  Investigation: No additional findings.  Imaging: No results found.  Recent Labs: Lab Results  Component Value Date   WBC 5.3 03/24/2021   HGB 14.0 03/24/2021   PLT 233 03/24/2021   NA 140 03/24/2021   K 4.6 03/24/2021   CL 105 03/24/2021   CO2 31  03/24/2021   GLUCOSE 94 03/24/2021   BUN 17 03/24/2021   CREATININE 1.25 (H) 03/24/2021   BILITOT 0.6 03/24/2021   AST 13 03/24/2021   ALT 14 03/24/2021   PROT 6.7 03/24/2021   CALCIUM 8.9 03/24/2021   GFRAA 57 (L) 10/23/2018   QFTBGOLDPLUS NEGATIVE 10/20/2020    Speciality Comments: No specialty comments available.  Procedures:  No procedures performed Allergies: Penicillins   Assessment / Plan:     Visit Diagnoses: No diagnosis found.  ***  Orders: No orders of the defined types were placed in this encounter.  No orders of the defined types were placed in this encounter.    Follow-Up Instructions: No follow-ups on file.   Collier Salina, MD  Note - This record has been created using [employer].  Chart creation errors have been sought, but may not  always  have been located. Such creation errors do not reflect on  the standard of medical care.

## 2022-03-27 ENCOUNTER — Ambulatory Visit: Payer: Medicare HMO | Attending: Internal Medicine | Admitting: Internal Medicine

## 2022-04-30 DIAGNOSIS — H524 Presbyopia: Secondary | ICD-10-CM | POA: Diagnosis not present

## 2022-04-30 DIAGNOSIS — H52223 Regular astigmatism, bilateral: Secondary | ICD-10-CM | POA: Diagnosis not present

## 2022-04-30 DIAGNOSIS — H02889 Meibomian gland dysfunction of unspecified eye, unspecified eyelid: Secondary | ICD-10-CM | POA: Diagnosis not present

## 2022-04-30 DIAGNOSIS — H02882 Meibomian gland dysfunction right lower eyelid: Secondary | ICD-10-CM | POA: Diagnosis not present

## 2022-05-31 DIAGNOSIS — I10 Essential (primary) hypertension: Secondary | ICD-10-CM | POA: Diagnosis not present

## 2022-05-31 DIAGNOSIS — E7849 Other hyperlipidemia: Secondary | ICD-10-CM | POA: Diagnosis not present

## 2022-05-31 DIAGNOSIS — K219 Gastro-esophageal reflux disease without esophagitis: Secondary | ICD-10-CM | POA: Diagnosis not present

## 2022-05-31 DIAGNOSIS — K118 Other diseases of salivary glands: Secondary | ICD-10-CM | POA: Diagnosis not present

## 2022-05-31 DIAGNOSIS — Z1329 Encounter for screening for other suspected endocrine disorder: Secondary | ICD-10-CM | POA: Diagnosis not present

## 2022-05-31 DIAGNOSIS — J449 Chronic obstructive pulmonary disease, unspecified: Secondary | ICD-10-CM | POA: Diagnosis not present

## 2022-05-31 DIAGNOSIS — R739 Hyperglycemia, unspecified: Secondary | ICD-10-CM | POA: Diagnosis not present

## 2022-06-06 DIAGNOSIS — E782 Mixed hyperlipidemia: Secondary | ICD-10-CM | POA: Diagnosis not present

## 2022-06-06 DIAGNOSIS — I119 Hypertensive heart disease without heart failure: Secondary | ICD-10-CM | POA: Diagnosis not present

## 2022-06-06 DIAGNOSIS — I4821 Permanent atrial fibrillation: Secondary | ICD-10-CM | POA: Diagnosis not present

## 2022-06-07 DIAGNOSIS — B351 Tinea unguium: Secondary | ICD-10-CM | POA: Diagnosis not present

## 2022-06-07 DIAGNOSIS — Z6830 Body mass index (BMI) 30.0-30.9, adult: Secondary | ICD-10-CM | POA: Diagnosis not present

## 2022-06-07 DIAGNOSIS — R1310 Dysphagia, unspecified: Secondary | ICD-10-CM | POA: Diagnosis not present

## 2022-06-07 DIAGNOSIS — J449 Chronic obstructive pulmonary disease, unspecified: Secondary | ICD-10-CM | POA: Diagnosis not present

## 2022-06-07 DIAGNOSIS — I10 Essential (primary) hypertension: Secondary | ICD-10-CM | POA: Diagnosis not present

## 2022-06-07 DIAGNOSIS — K219 Gastro-esophageal reflux disease without esophagitis: Secondary | ICD-10-CM | POA: Diagnosis not present

## 2022-06-07 DIAGNOSIS — E7849 Other hyperlipidemia: Secondary | ICD-10-CM | POA: Diagnosis not present

## 2022-06-07 DIAGNOSIS — E1165 Type 2 diabetes mellitus with hyperglycemia: Secondary | ICD-10-CM | POA: Diagnosis not present

## 2022-06-12 DIAGNOSIS — Z1231 Encounter for screening mammogram for malignant neoplasm of breast: Secondary | ICD-10-CM | POA: Diagnosis not present

## 2022-07-12 DIAGNOSIS — I340 Nonrheumatic mitral (valve) insufficiency: Secondary | ICD-10-CM | POA: Diagnosis not present

## 2022-07-12 DIAGNOSIS — I517 Cardiomegaly: Secondary | ICD-10-CM | POA: Diagnosis not present

## 2022-07-12 DIAGNOSIS — I119 Hypertensive heart disease without heart failure: Secondary | ICD-10-CM | POA: Diagnosis not present

## 2022-07-18 DIAGNOSIS — H1045 Other chronic allergic conjunctivitis: Secondary | ICD-10-CM | POA: Diagnosis not present

## 2022-08-09 DIAGNOSIS — H01002 Unspecified blepharitis right lower eyelid: Secondary | ICD-10-CM | POA: Diagnosis not present

## 2022-08-09 DIAGNOSIS — H01004 Unspecified blepharitis left upper eyelid: Secondary | ICD-10-CM | POA: Diagnosis not present

## 2022-08-09 DIAGNOSIS — H01005 Unspecified blepharitis left lower eyelid: Secondary | ICD-10-CM | POA: Diagnosis not present

## 2022-08-09 DIAGNOSIS — H01001 Unspecified blepharitis right upper eyelid: Secondary | ICD-10-CM | POA: Diagnosis not present

## 2022-08-09 DIAGNOSIS — Z961 Presence of intraocular lens: Secondary | ICD-10-CM | POA: Diagnosis not present

## 2022-08-20 DIAGNOSIS — M79672 Pain in left foot: Secondary | ICD-10-CM | POA: Diagnosis not present

## 2022-08-20 DIAGNOSIS — M79674 Pain in right toe(s): Secondary | ICD-10-CM | POA: Diagnosis not present

## 2022-08-20 DIAGNOSIS — L609 Nail disorder, unspecified: Secondary | ICD-10-CM | POA: Diagnosis not present

## 2022-08-20 DIAGNOSIS — M79671 Pain in right foot: Secondary | ICD-10-CM | POA: Diagnosis not present

## 2022-08-20 DIAGNOSIS — B351 Tinea unguium: Secondary | ICD-10-CM | POA: Diagnosis not present

## 2022-08-20 DIAGNOSIS — M79675 Pain in left toe(s): Secondary | ICD-10-CM | POA: Diagnosis not present

## 2022-09-03 DIAGNOSIS — E1165 Type 2 diabetes mellitus with hyperglycemia: Secondary | ICD-10-CM | POA: Diagnosis not present

## 2022-09-03 DIAGNOSIS — J449 Chronic obstructive pulmonary disease, unspecified: Secondary | ICD-10-CM | POA: Diagnosis not present

## 2022-09-03 DIAGNOSIS — E7849 Other hyperlipidemia: Secondary | ICD-10-CM | POA: Diagnosis not present

## 2022-09-03 DIAGNOSIS — I10 Essential (primary) hypertension: Secondary | ICD-10-CM | POA: Diagnosis not present

## 2022-09-10 DIAGNOSIS — H53142 Visual discomfort, left eye: Secondary | ICD-10-CM | POA: Diagnosis not present

## 2022-09-10 DIAGNOSIS — Z87891 Personal history of nicotine dependence: Secondary | ICD-10-CM | POA: Diagnosis not present

## 2022-09-10 DIAGNOSIS — Z880 Allergy status to penicillin: Secondary | ICD-10-CM | POA: Diagnosis not present

## 2022-09-10 DIAGNOSIS — H5712 Ocular pain, left eye: Secondary | ICD-10-CM | POA: Diagnosis not present

## 2022-09-10 DIAGNOSIS — E119 Type 2 diabetes mellitus without complications: Secondary | ICD-10-CM | POA: Diagnosis not present

## 2022-09-10 DIAGNOSIS — I10 Essential (primary) hypertension: Secondary | ICD-10-CM | POA: Diagnosis not present

## 2022-09-10 DIAGNOSIS — Z79899 Other long term (current) drug therapy: Secondary | ICD-10-CM | POA: Diagnosis not present

## 2022-09-10 DIAGNOSIS — J449 Chronic obstructive pulmonary disease, unspecified: Secondary | ICD-10-CM | POA: Diagnosis not present

## 2022-09-10 DIAGNOSIS — K219 Gastro-esophageal reflux disease without esophagitis: Secondary | ICD-10-CM | POA: Diagnosis not present

## 2022-09-11 DIAGNOSIS — H5712 Ocular pain, left eye: Secondary | ICD-10-CM | POA: Diagnosis not present

## 2022-09-12 DIAGNOSIS — I10 Essential (primary) hypertension: Secondary | ICD-10-CM | POA: Diagnosis not present

## 2022-09-12 DIAGNOSIS — Z6830 Body mass index (BMI) 30.0-30.9, adult: Secondary | ICD-10-CM | POA: Diagnosis not present

## 2022-09-12 DIAGNOSIS — R030 Elevated blood-pressure reading, without diagnosis of hypertension: Secondary | ICD-10-CM | POA: Diagnosis not present

## 2022-09-12 DIAGNOSIS — E1165 Type 2 diabetes mellitus with hyperglycemia: Secondary | ICD-10-CM | POA: Diagnosis not present

## 2022-09-12 DIAGNOSIS — J449 Chronic obstructive pulmonary disease, unspecified: Secondary | ICD-10-CM | POA: Diagnosis not present

## 2022-09-12 DIAGNOSIS — E7849 Other hyperlipidemia: Secondary | ICD-10-CM | POA: Diagnosis not present

## 2022-09-12 DIAGNOSIS — K219 Gastro-esophageal reflux disease without esophagitis: Secondary | ICD-10-CM | POA: Diagnosis not present

## 2022-10-17 DIAGNOSIS — E782 Mixed hyperlipidemia: Secondary | ICD-10-CM | POA: Diagnosis not present

## 2022-10-17 DIAGNOSIS — K219 Gastro-esophageal reflux disease without esophagitis: Secondary | ICD-10-CM | POA: Diagnosis not present

## 2022-10-17 DIAGNOSIS — I10 Essential (primary) hypertension: Secondary | ICD-10-CM | POA: Diagnosis not present

## 2022-10-17 DIAGNOSIS — J449 Chronic obstructive pulmonary disease, unspecified: Secondary | ICD-10-CM | POA: Diagnosis not present

## 2022-10-31 DIAGNOSIS — F419 Anxiety disorder, unspecified: Secondary | ICD-10-CM | POA: Diagnosis not present

## 2022-10-31 DIAGNOSIS — Z6831 Body mass index (BMI) 31.0-31.9, adult: Secondary | ICD-10-CM | POA: Diagnosis not present

## 2022-10-31 DIAGNOSIS — K219 Gastro-esophageal reflux disease without esophagitis: Secondary | ICD-10-CM | POA: Diagnosis not present

## 2022-10-31 DIAGNOSIS — Z8249 Family history of ischemic heart disease and other diseases of the circulatory system: Secondary | ICD-10-CM | POA: Diagnosis not present

## 2022-10-31 DIAGNOSIS — I10 Essential (primary) hypertension: Secondary | ICD-10-CM | POA: Diagnosis not present

## 2022-10-31 DIAGNOSIS — M1990 Unspecified osteoarthritis, unspecified site: Secondary | ICD-10-CM | POA: Diagnosis not present

## 2022-10-31 DIAGNOSIS — Z880 Allergy status to penicillin: Secondary | ICD-10-CM | POA: Diagnosis not present

## 2022-10-31 DIAGNOSIS — Z008 Encounter for other general examination: Secondary | ICD-10-CM | POA: Diagnosis not present

## 2022-10-31 DIAGNOSIS — Z87891 Personal history of nicotine dependence: Secondary | ICD-10-CM | POA: Diagnosis not present

## 2022-10-31 DIAGNOSIS — F329 Major depressive disorder, single episode, unspecified: Secondary | ICD-10-CM | POA: Diagnosis not present

## 2022-10-31 DIAGNOSIS — L309 Dermatitis, unspecified: Secondary | ICD-10-CM | POA: Diagnosis not present

## 2022-10-31 DIAGNOSIS — E785 Hyperlipidemia, unspecified: Secondary | ICD-10-CM | POA: Diagnosis not present

## 2022-10-31 DIAGNOSIS — Z5948 Other specified lack of adequate food: Secondary | ICD-10-CM | POA: Diagnosis not present

## 2022-11-12 ENCOUNTER — Ambulatory Visit: Payer: Medicare HMO | Admitting: Adult Health

## 2022-11-14 DIAGNOSIS — H01004 Unspecified blepharitis left upper eyelid: Secondary | ICD-10-CM | POA: Diagnosis not present

## 2022-11-14 DIAGNOSIS — H01005 Unspecified blepharitis left lower eyelid: Secondary | ICD-10-CM | POA: Diagnosis not present

## 2022-11-14 DIAGNOSIS — H01001 Unspecified blepharitis right upper eyelid: Secondary | ICD-10-CM | POA: Diagnosis not present

## 2022-11-14 DIAGNOSIS — H01002 Unspecified blepharitis right lower eyelid: Secondary | ICD-10-CM | POA: Diagnosis not present

## 2022-12-06 DIAGNOSIS — L609 Nail disorder, unspecified: Secondary | ICD-10-CM | POA: Diagnosis not present

## 2022-12-06 DIAGNOSIS — B351 Tinea unguium: Secondary | ICD-10-CM | POA: Diagnosis not present

## 2022-12-06 DIAGNOSIS — M79672 Pain in left foot: Secondary | ICD-10-CM | POA: Diagnosis not present

## 2022-12-06 DIAGNOSIS — M79674 Pain in right toe(s): Secondary | ICD-10-CM | POA: Diagnosis not present

## 2022-12-06 DIAGNOSIS — M79675 Pain in left toe(s): Secondary | ICD-10-CM | POA: Diagnosis not present

## 2022-12-06 DIAGNOSIS — M79671 Pain in right foot: Secondary | ICD-10-CM | POA: Diagnosis not present

## 2022-12-13 DIAGNOSIS — I517 Cardiomegaly: Secondary | ICD-10-CM | POA: Diagnosis not present

## 2022-12-13 DIAGNOSIS — N182 Chronic kidney disease, stage 2 (mild): Secondary | ICD-10-CM | POA: Diagnosis not present

## 2022-12-13 DIAGNOSIS — E782 Mixed hyperlipidemia: Secondary | ICD-10-CM | POA: Diagnosis not present

## 2022-12-13 DIAGNOSIS — G4733 Obstructive sleep apnea (adult) (pediatric): Secondary | ICD-10-CM | POA: Diagnosis not present

## 2022-12-13 DIAGNOSIS — I1310 Hypertensive heart and chronic kidney disease without heart failure, with stage 1 through stage 4 chronic kidney disease, or unspecified chronic kidney disease: Secondary | ICD-10-CM | POA: Diagnosis not present

## 2022-12-13 DIAGNOSIS — R7303 Prediabetes: Secondary | ICD-10-CM | POA: Diagnosis not present

## 2022-12-27 DIAGNOSIS — K1379 Other lesions of oral mucosa: Secondary | ICD-10-CM | POA: Diagnosis not present

## 2022-12-27 DIAGNOSIS — Z6830 Body mass index (BMI) 30.0-30.9, adult: Secondary | ICD-10-CM | POA: Diagnosis not present

## 2022-12-27 DIAGNOSIS — R109 Unspecified abdominal pain: Secondary | ICD-10-CM | POA: Diagnosis not present

## 2022-12-27 DIAGNOSIS — R110 Nausea: Secondary | ICD-10-CM | POA: Diagnosis not present

## 2023-01-10 DIAGNOSIS — E782 Mixed hyperlipidemia: Secondary | ICD-10-CM | POA: Diagnosis not present

## 2023-01-10 DIAGNOSIS — K219 Gastro-esophageal reflux disease without esophagitis: Secondary | ICD-10-CM | POA: Diagnosis not present

## 2023-01-10 DIAGNOSIS — E1165 Type 2 diabetes mellitus with hyperglycemia: Secondary | ICD-10-CM | POA: Diagnosis not present

## 2023-01-10 DIAGNOSIS — I10 Essential (primary) hypertension: Secondary | ICD-10-CM | POA: Diagnosis not present

## 2023-01-23 DIAGNOSIS — I10 Essential (primary) hypertension: Secondary | ICD-10-CM | POA: Diagnosis not present

## 2023-01-23 DIAGNOSIS — J449 Chronic obstructive pulmonary disease, unspecified: Secondary | ICD-10-CM | POA: Diagnosis not present

## 2023-01-23 DIAGNOSIS — E7849 Other hyperlipidemia: Secondary | ICD-10-CM | POA: Diagnosis not present

## 2023-01-23 DIAGNOSIS — K219 Gastro-esophageal reflux disease without esophagitis: Secondary | ICD-10-CM | POA: Diagnosis not present

## 2023-01-23 DIAGNOSIS — E1165 Type 2 diabetes mellitus with hyperglycemia: Secondary | ICD-10-CM | POA: Diagnosis not present

## 2023-01-23 DIAGNOSIS — N1832 Chronic kidney disease, stage 3b: Secondary | ICD-10-CM | POA: Diagnosis not present

## 2023-01-23 DIAGNOSIS — Z6830 Body mass index (BMI) 30.0-30.9, adult: Secondary | ICD-10-CM | POA: Diagnosis not present

## 2023-02-19 DIAGNOSIS — H01001 Unspecified blepharitis right upper eyelid: Secondary | ICD-10-CM | POA: Diagnosis not present

## 2023-02-19 DIAGNOSIS — H1045 Other chronic allergic conjunctivitis: Secondary | ICD-10-CM | POA: Diagnosis not present

## 2023-02-19 DIAGNOSIS — H01002 Unspecified blepharitis right lower eyelid: Secondary | ICD-10-CM | POA: Diagnosis not present

## 2023-02-19 DIAGNOSIS — H01004 Unspecified blepharitis left upper eyelid: Secondary | ICD-10-CM | POA: Diagnosis not present

## 2023-03-03 DIAGNOSIS — Z20822 Contact with and (suspected) exposure to covid-19: Secondary | ICD-10-CM | POA: Diagnosis not present

## 2023-03-03 DIAGNOSIS — G4730 Sleep apnea, unspecified: Secondary | ICD-10-CM | POA: Diagnosis not present

## 2023-03-03 DIAGNOSIS — E119 Type 2 diabetes mellitus without complications: Secondary | ICD-10-CM | POA: Diagnosis not present

## 2023-03-03 DIAGNOSIS — J189 Pneumonia, unspecified organism: Secondary | ICD-10-CM | POA: Diagnosis not present

## 2023-03-03 DIAGNOSIS — Z79899 Other long term (current) drug therapy: Secondary | ICD-10-CM | POA: Diagnosis not present

## 2023-03-03 DIAGNOSIS — Z5986 Financial insecurity: Secondary | ICD-10-CM | POA: Diagnosis not present

## 2023-03-03 DIAGNOSIS — K219 Gastro-esophageal reflux disease without esophagitis: Secondary | ICD-10-CM | POA: Diagnosis not present

## 2023-03-03 DIAGNOSIS — R0981 Nasal congestion: Secondary | ICD-10-CM | POA: Diagnosis not present

## 2023-03-03 DIAGNOSIS — R490 Dysphonia: Secondary | ICD-10-CM | POA: Diagnosis not present

## 2023-03-03 DIAGNOSIS — Z880 Allergy status to penicillin: Secondary | ICD-10-CM | POA: Diagnosis not present

## 2023-03-03 DIAGNOSIS — Z8611 Personal history of tuberculosis: Secondary | ICD-10-CM | POA: Diagnosis not present

## 2023-03-03 DIAGNOSIS — J180 Bronchopneumonia, unspecified organism: Secondary | ICD-10-CM | POA: Diagnosis not present

## 2023-03-03 DIAGNOSIS — R059 Cough, unspecified: Secondary | ICD-10-CM | POA: Diagnosis not present

## 2023-03-03 DIAGNOSIS — I10 Essential (primary) hypertension: Secondary | ICD-10-CM | POA: Diagnosis not present

## 2023-03-03 DIAGNOSIS — Z90710 Acquired absence of both cervix and uterus: Secondary | ICD-10-CM | POA: Diagnosis not present

## 2023-03-03 DIAGNOSIS — Z87891 Personal history of nicotine dependence: Secondary | ICD-10-CM | POA: Diagnosis not present

## 2023-03-03 DIAGNOSIS — J449 Chronic obstructive pulmonary disease, unspecified: Secondary | ICD-10-CM | POA: Diagnosis not present

## 2023-03-03 DIAGNOSIS — Z5941 Food insecurity: Secondary | ICD-10-CM | POA: Diagnosis not present

## 2023-03-07 DIAGNOSIS — Z6830 Body mass index (BMI) 30.0-30.9, adult: Secondary | ICD-10-CM | POA: Diagnosis not present

## 2023-03-07 DIAGNOSIS — R300 Dysuria: Secondary | ICD-10-CM | POA: Diagnosis not present

## 2023-03-07 DIAGNOSIS — J189 Pneumonia, unspecified organism: Secondary | ICD-10-CM | POA: Diagnosis not present

## 2023-03-19 DIAGNOSIS — E1165 Type 2 diabetes mellitus with hyperglycemia: Secondary | ICD-10-CM | POA: Diagnosis not present

## 2023-03-19 DIAGNOSIS — E782 Mixed hyperlipidemia: Secondary | ICD-10-CM | POA: Diagnosis not present

## 2023-03-19 DIAGNOSIS — K219 Gastro-esophageal reflux disease without esophagitis: Secondary | ICD-10-CM | POA: Diagnosis not present

## 2023-03-19 DIAGNOSIS — I10 Essential (primary) hypertension: Secondary | ICD-10-CM | POA: Diagnosis not present

## 2023-03-19 DIAGNOSIS — F32A Depression, unspecified: Secondary | ICD-10-CM | POA: Diagnosis not present

## 2023-05-21 DIAGNOSIS — M79671 Pain in right foot: Secondary | ICD-10-CM | POA: Diagnosis not present

## 2023-05-21 DIAGNOSIS — M79674 Pain in right toe(s): Secondary | ICD-10-CM | POA: Diagnosis not present

## 2023-05-21 DIAGNOSIS — L600 Ingrowing nail: Secondary | ICD-10-CM | POA: Diagnosis not present

## 2023-05-21 DIAGNOSIS — L608 Other nail disorders: Secondary | ICD-10-CM | POA: Diagnosis not present

## 2023-05-21 DIAGNOSIS — L03031 Cellulitis of right toe: Secondary | ICD-10-CM | POA: Diagnosis not present

## 2023-06-04 DIAGNOSIS — L03031 Cellulitis of right toe: Secondary | ICD-10-CM | POA: Diagnosis not present

## 2023-06-04 DIAGNOSIS — M79671 Pain in right foot: Secondary | ICD-10-CM | POA: Diagnosis not present

## 2023-06-04 DIAGNOSIS — M79674 Pain in right toe(s): Secondary | ICD-10-CM | POA: Diagnosis not present

## 2023-06-04 DIAGNOSIS — L600 Ingrowing nail: Secondary | ICD-10-CM | POA: Diagnosis not present

## 2023-06-28 DIAGNOSIS — E782 Mixed hyperlipidemia: Secondary | ICD-10-CM | POA: Diagnosis not present

## 2023-06-28 DIAGNOSIS — K219 Gastro-esophageal reflux disease without esophagitis: Secondary | ICD-10-CM | POA: Diagnosis not present

## 2023-06-28 DIAGNOSIS — E1165 Type 2 diabetes mellitus with hyperglycemia: Secondary | ICD-10-CM | POA: Diagnosis not present

## 2023-06-28 DIAGNOSIS — I10 Essential (primary) hypertension: Secondary | ICD-10-CM | POA: Diagnosis not present

## 2023-06-28 DIAGNOSIS — Z6830 Body mass index (BMI) 30.0-30.9, adult: Secondary | ICD-10-CM | POA: Diagnosis not present

## 2023-07-03 DIAGNOSIS — I10 Essential (primary) hypertension: Secondary | ICD-10-CM | POA: Diagnosis not present

## 2023-07-03 DIAGNOSIS — E782 Mixed hyperlipidemia: Secondary | ICD-10-CM | POA: Diagnosis not present

## 2023-07-03 DIAGNOSIS — I119 Hypertensive heart disease without heart failure: Secondary | ICD-10-CM | POA: Diagnosis not present

## 2023-07-10 DIAGNOSIS — Z1231 Encounter for screening mammogram for malignant neoplasm of breast: Secondary | ICD-10-CM | POA: Diagnosis not present

## 2023-07-17 DIAGNOSIS — E1165 Type 2 diabetes mellitus with hyperglycemia: Secondary | ICD-10-CM | POA: Diagnosis not present

## 2023-07-17 DIAGNOSIS — E782 Mixed hyperlipidemia: Secondary | ICD-10-CM | POA: Diagnosis not present

## 2023-07-17 DIAGNOSIS — F329 Major depressive disorder, single episode, unspecified: Secondary | ICD-10-CM | POA: Diagnosis not present

## 2023-07-29 DIAGNOSIS — M67471 Ganglion, right ankle and foot: Secondary | ICD-10-CM | POA: Diagnosis not present

## 2023-07-29 DIAGNOSIS — M79674 Pain in right toe(s): Secondary | ICD-10-CM | POA: Diagnosis not present

## 2023-07-29 DIAGNOSIS — M79671 Pain in right foot: Secondary | ICD-10-CM | POA: Diagnosis not present

## 2023-08-14 DIAGNOSIS — J449 Chronic obstructive pulmonary disease, unspecified: Secondary | ICD-10-CM | POA: Diagnosis not present

## 2023-08-14 DIAGNOSIS — E119 Type 2 diabetes mellitus without complications: Secondary | ICD-10-CM | POA: Diagnosis not present

## 2023-08-14 DIAGNOSIS — M47816 Spondylosis without myelopathy or radiculopathy, lumbar region: Secondary | ICD-10-CM | POA: Diagnosis not present

## 2023-08-14 DIAGNOSIS — I10 Essential (primary) hypertension: Secondary | ICD-10-CM | POA: Diagnosis not present

## 2023-08-14 DIAGNOSIS — Z87891 Personal history of nicotine dependence: Secondary | ICD-10-CM | POA: Diagnosis not present

## 2023-08-14 DIAGNOSIS — M19012 Primary osteoarthritis, left shoulder: Secondary | ICD-10-CM | POA: Diagnosis not present

## 2023-08-14 DIAGNOSIS — Z880 Allergy status to penicillin: Secondary | ICD-10-CM | POA: Diagnosis not present

## 2023-08-14 DIAGNOSIS — R109 Unspecified abdominal pain: Secondary | ICD-10-CM | POA: Diagnosis not present

## 2023-08-14 DIAGNOSIS — Z5941 Food insecurity: Secondary | ICD-10-CM | POA: Diagnosis not present

## 2023-08-14 DIAGNOSIS — M25512 Pain in left shoulder: Secondary | ICD-10-CM | POA: Diagnosis not present

## 2023-08-16 DIAGNOSIS — E782 Mixed hyperlipidemia: Secondary | ICD-10-CM | POA: Diagnosis not present

## 2023-08-16 DIAGNOSIS — F329 Major depressive disorder, single episode, unspecified: Secondary | ICD-10-CM | POA: Diagnosis not present

## 2023-08-16 DIAGNOSIS — E1165 Type 2 diabetes mellitus with hyperglycemia: Secondary | ICD-10-CM | POA: Diagnosis not present

## 2023-09-16 DIAGNOSIS — F329 Major depressive disorder, single episode, unspecified: Secondary | ICD-10-CM | POA: Diagnosis not present

## 2023-09-16 DIAGNOSIS — E782 Mixed hyperlipidemia: Secondary | ICD-10-CM | POA: Diagnosis not present

## 2023-09-16 DIAGNOSIS — E1165 Type 2 diabetes mellitus with hyperglycemia: Secondary | ICD-10-CM | POA: Diagnosis not present

## 2023-10-17 DIAGNOSIS — E1165 Type 2 diabetes mellitus with hyperglycemia: Secondary | ICD-10-CM | POA: Diagnosis not present

## 2023-10-17 DIAGNOSIS — E782 Mixed hyperlipidemia: Secondary | ICD-10-CM | POA: Diagnosis not present

## 2023-10-17 DIAGNOSIS — F329 Major depressive disorder, single episode, unspecified: Secondary | ICD-10-CM | POA: Diagnosis not present

## 2023-10-21 DIAGNOSIS — E1165 Type 2 diabetes mellitus with hyperglycemia: Secondary | ICD-10-CM | POA: Diagnosis not present

## 2023-10-21 DIAGNOSIS — I10 Essential (primary) hypertension: Secondary | ICD-10-CM | POA: Diagnosis not present

## 2023-10-21 DIAGNOSIS — E782 Mixed hyperlipidemia: Secondary | ICD-10-CM | POA: Diagnosis not present

## 2023-10-21 DIAGNOSIS — F329 Major depressive disorder, single episode, unspecified: Secondary | ICD-10-CM | POA: Diagnosis not present

## 2023-10-21 DIAGNOSIS — E7849 Other hyperlipidemia: Secondary | ICD-10-CM | POA: Diagnosis not present

## 2023-10-21 DIAGNOSIS — Z6830 Body mass index (BMI) 30.0-30.9, adult: Secondary | ICD-10-CM | POA: Diagnosis not present

## 2023-10-22 ENCOUNTER — Other Ambulatory Visit (HOSPITAL_COMMUNITY): Payer: Self-pay | Admitting: Nephrology

## 2023-10-22 DIAGNOSIS — G4733 Obstructive sleep apnea (adult) (pediatric): Secondary | ICD-10-CM | POA: Diagnosis not present

## 2023-10-22 DIAGNOSIS — I129 Hypertensive chronic kidney disease with stage 1 through stage 4 chronic kidney disease, or unspecified chronic kidney disease: Secondary | ICD-10-CM | POA: Diagnosis not present

## 2023-10-22 DIAGNOSIS — N1832 Chronic kidney disease, stage 3b: Secondary | ICD-10-CM

## 2023-10-22 DIAGNOSIS — N189 Chronic kidney disease, unspecified: Secondary | ICD-10-CM

## 2023-10-22 DIAGNOSIS — E1122 Type 2 diabetes mellitus with diabetic chronic kidney disease: Secondary | ICD-10-CM | POA: Diagnosis not present

## 2023-10-29 ENCOUNTER — Ambulatory Visit (HOSPITAL_COMMUNITY)
Admission: RE | Admit: 2023-10-29 | Discharge: 2023-10-29 | Disposition: A | Discharge status: Home / Self Care | Source: Ambulatory Visit | Attending: Nephrology | Admitting: Nephrology

## 2023-10-29 DIAGNOSIS — N1832 Chronic kidney disease, stage 3b: Secondary | ICD-10-CM | POA: Diagnosis not present

## 2023-10-29 DIAGNOSIS — N189 Chronic kidney disease, unspecified: Secondary | ICD-10-CM | POA: Diagnosis not present

## 2023-10-29 DIAGNOSIS — N1330 Unspecified hydronephrosis: Secondary | ICD-10-CM | POA: Diagnosis not present

## 2023-11-15 DIAGNOSIS — F329 Major depressive disorder, single episode, unspecified: Secondary | ICD-10-CM | POA: Diagnosis not present

## 2023-11-15 DIAGNOSIS — E1165 Type 2 diabetes mellitus with hyperglycemia: Secondary | ICD-10-CM | POA: Diagnosis not present

## 2023-11-15 DIAGNOSIS — E782 Mixed hyperlipidemia: Secondary | ICD-10-CM | POA: Diagnosis not present

## 2023-11-17 DIAGNOSIS — G4733 Obstructive sleep apnea (adult) (pediatric): Secondary | ICD-10-CM | POA: Diagnosis not present

## 2023-12-11 DIAGNOSIS — Z6830 Body mass index (BMI) 30.0-30.9, adult: Secondary | ICD-10-CM | POA: Diagnosis not present

## 2023-12-11 DIAGNOSIS — M25521 Pain in right elbow: Secondary | ICD-10-CM | POA: Diagnosis not present

## 2023-12-14 DIAGNOSIS — R6883 Chills (without fever): Secondary | ICD-10-CM | POA: Diagnosis not present

## 2023-12-14 DIAGNOSIS — R059 Cough, unspecified: Secondary | ICD-10-CM | POA: Diagnosis not present

## 2023-12-14 DIAGNOSIS — Z1152 Encounter for screening for COVID-19: Secondary | ICD-10-CM | POA: Diagnosis not present

## 2023-12-14 DIAGNOSIS — J069 Acute upper respiratory infection, unspecified: Secondary | ICD-10-CM | POA: Diagnosis not present

## 2023-12-14 DIAGNOSIS — Z79899 Other long term (current) drug therapy: Secondary | ICD-10-CM | POA: Diagnosis not present

## 2023-12-14 DIAGNOSIS — Z20822 Contact with and (suspected) exposure to covid-19: Secondary | ICD-10-CM | POA: Diagnosis not present

## 2023-12-14 DIAGNOSIS — J029 Acute pharyngitis, unspecified: Secondary | ICD-10-CM | POA: Diagnosis not present

## 2023-12-14 DIAGNOSIS — I10 Essential (primary) hypertension: Secondary | ICD-10-CM | POA: Diagnosis not present

## 2023-12-14 DIAGNOSIS — Z880 Allergy status to penicillin: Secondary | ICD-10-CM | POA: Diagnosis not present

## 2023-12-14 DIAGNOSIS — E119 Type 2 diabetes mellitus without complications: Secondary | ICD-10-CM | POA: Diagnosis not present

## 2023-12-14 DIAGNOSIS — Z87891 Personal history of nicotine dependence: Secondary | ICD-10-CM | POA: Diagnosis not present

## 2023-12-17 DIAGNOSIS — E1165 Type 2 diabetes mellitus with hyperglycemia: Secondary | ICD-10-CM | POA: Diagnosis not present

## 2023-12-17 DIAGNOSIS — F329 Major depressive disorder, single episode, unspecified: Secondary | ICD-10-CM | POA: Diagnosis not present

## 2023-12-17 DIAGNOSIS — E782 Mixed hyperlipidemia: Secondary | ICD-10-CM | POA: Diagnosis not present

## 2023-12-27 DIAGNOSIS — Z23 Encounter for immunization: Secondary | ICD-10-CM | POA: Diagnosis not present

## 2024-01-16 DIAGNOSIS — E1122 Type 2 diabetes mellitus with diabetic chronic kidney disease: Secondary | ICD-10-CM | POA: Diagnosis not present

## 2024-01-16 DIAGNOSIS — R778 Other specified abnormalities of plasma proteins: Secondary | ICD-10-CM | POA: Diagnosis not present

## 2024-01-16 DIAGNOSIS — N1832 Chronic kidney disease, stage 3b: Secondary | ICD-10-CM | POA: Diagnosis not present

## 2024-01-16 DIAGNOSIS — I129 Hypertensive chronic kidney disease with stage 1 through stage 4 chronic kidney disease, or unspecified chronic kidney disease: Secondary | ICD-10-CM | POA: Diagnosis not present

## 2024-01-16 DIAGNOSIS — M069 Rheumatoid arthritis, unspecified: Secondary | ICD-10-CM | POA: Diagnosis not present

## 2024-01-17 DIAGNOSIS — E782 Mixed hyperlipidemia: Secondary | ICD-10-CM | POA: Diagnosis not present

## 2024-01-17 DIAGNOSIS — E1165 Type 2 diabetes mellitus with hyperglycemia: Secondary | ICD-10-CM | POA: Diagnosis not present

## 2024-01-17 DIAGNOSIS — F329 Major depressive disorder, single episode, unspecified: Secondary | ICD-10-CM | POA: Diagnosis not present

## 2024-01-27 DIAGNOSIS — B351 Tinea unguium: Secondary | ICD-10-CM | POA: Diagnosis not present

## 2024-01-27 DIAGNOSIS — M79674 Pain in right toe(s): Secondary | ICD-10-CM | POA: Diagnosis not present

## 2024-01-27 DIAGNOSIS — M79672 Pain in left foot: Secondary | ICD-10-CM | POA: Diagnosis not present

## 2024-01-27 DIAGNOSIS — L609 Nail disorder, unspecified: Secondary | ICD-10-CM | POA: Diagnosis not present

## 2024-01-27 DIAGNOSIS — M79671 Pain in right foot: Secondary | ICD-10-CM | POA: Diagnosis not present

## 2024-01-27 DIAGNOSIS — M79675 Pain in left toe(s): Secondary | ICD-10-CM | POA: Diagnosis not present

## 2024-02-14 DIAGNOSIS — E782 Mixed hyperlipidemia: Secondary | ICD-10-CM | POA: Diagnosis not present

## 2024-02-14 DIAGNOSIS — F329 Major depressive disorder, single episode, unspecified: Secondary | ICD-10-CM | POA: Diagnosis not present

## 2024-02-14 DIAGNOSIS — E1165 Type 2 diabetes mellitus with hyperglycemia: Secondary | ICD-10-CM | POA: Diagnosis not present

## 2024-02-14 NOTE — Progress Notes (Signed)
 CENA BRUHN                                          MRN: [id number]   02/14/2024   The [facility] Quality Team Specialist reviewed this patient medical record for the purposes of chart review for care gap closure. The following were reviewed: chart review for care gap closure-kidney health evaluation for diabetes:uACR.    VBCI Quality Team

## 2024-02-15 DIAGNOSIS — Q6239 Other obstructive defects of renal pelvis and ureter: Secondary | ICD-10-CM | POA: Diagnosis not present

## 2024-02-15 DIAGNOSIS — N182 Chronic kidney disease, stage 2 (mild): Secondary | ICD-10-CM | POA: Diagnosis not present

## 2024-02-15 DIAGNOSIS — E559 Vitamin D deficiency, unspecified: Secondary | ICD-10-CM | POA: Diagnosis not present

## 2024-02-15 DIAGNOSIS — M069 Rheumatoid arthritis, unspecified: Secondary | ICD-10-CM | POA: Diagnosis not present

## 2024-03-30 ENCOUNTER — Ambulatory Visit: Admitting: Internal Medicine

## 2024-04-01 ENCOUNTER — Ambulatory Visit: Admitting: Surgical

## 2024-04-01 ENCOUNTER — Other Ambulatory Visit: Payer: Self-pay

## 2024-04-01 DIAGNOSIS — M25521 Pain in right elbow: Secondary | ICD-10-CM

## 2024-04-01 DIAGNOSIS — M7701 Medial epicondylitis, right elbow: Secondary | ICD-10-CM

## 2024-04-01 NOTE — Progress Notes (Signed)
 "  Office Visit Note   Patient: Cynthia Brewer           Date of Birth: [birth year]           MRN: [id number] Visit Date: 04/01/2024 Requested by: Dow Longs, PA-C [address] [place],  [place] [address] PCP: Dow Longs, PA-C  Subjective: Chief Complaint  Patient presents with   right elbow    HPI: Cynthia Brewer is a 74 y.o. female who presents to the office reporting right elbow pain.  Patient states that she has noticed increased swelling and pain in the medial aspect of her right elbow.  She notices that has been more sore and slightly bigger over the last few months.  She is right-hand dominant.  Denies any history of injury.  She describes a pressure sensation and pain at times.  Pain never wakes her up from sleep.  She is not used to having any elbow pain and has never had any issues with her elbow in the past.  No history of prior elbow surgery.  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  Currently on disability for depression and rheumatoid arthritis.  Has not had any treatment for her RA; she is set up to see a rheumatologist in [place] coming up soon.  Denies any numbness or tingling.  No mechanical symptoms in the elbow.  No restriction to range of motion..                ROS: All systems reviewed are negative as they relate to the chief complaint within the history of present illness.  Patient denies fevers or chills.  Assessment & Plan: Visit Diagnoses:  1. Medial epicondylitis of right elbow   2. Pain in right elbow     Plan: Impression is 74 year old female with right elbow medial epicondylitis.  She has tenderness over this region as well as pain reproduced with resisted pronation.  There is no palpable mass or cyst noted.  She has radiographs taken today demonstrating slight enthesophyte formation and prominence of the medial epicondyle.  We discussed options availed the patient.  She would like to try physical therapy to evaluate  and treat medial epicondylitis for 1-2 sessions per week for 6 weeks and we will reevaluate her when she returns.  If no improvement, could consider MRI versus shockwave therapy versus injection.  AP, lateral, oblique views of right elbow reviewed.  No fracture or dislocation.  Prominence of the medial epicondyle noted with slight enthesophyte formation.  No such formation over the lateral epicondyle.  No significant evidence of degenerative changes of the elbow joint itself.  Follow-Up Instructions: No follow-ups on file.   Orders:  Orders Placed This Encounter  Procedures   DG ELBOW COMPLETE RIGHT (3+VIEW)   Ambulatory referral to Physical Therapy   No orders of the defined types were placed in this encounter.     Procedures: No procedures performed   Clinical Data: No additional findings.  Objective: Vital Signs: There were no vitals taken for this visit.  Physical Exam:  Constitutional: Patient appears well-developed HEENT:  Head: Normocephalic Eyes:EOM are normal Neck: Normal range of motion Cardiovascular: Normal rate Pulmonary/chest: Effort normal Neurologic: Patient is alert Skin: Skin is warm Psychiatric: Patient has normal mood and affect  Ortho Exam: Ortho exam demonstrates intact EPL, FPL, finger abduction, radial/supination, bicep, tricep, deltoid.  She has no significant tenderness over the lateral epicondyle, distal bicep tendon,  olecranon.  There is no swelling observed or any fluctuance noted throughout these regions.  She has mild swelling noted over the medial epicondyle with point tenderness over this structure.  Pain is reproduced with resisted pronation but not wrist flexion.  Negative hook test.  She has excellent range of motion with 0 degrees extension and greater than 120 degrees of elbow flexion.  No cellulitis or skin changes noted.  No lymphadenopathy.  Specialty Comments:  No specialty comments available.  Imaging: No results found.   PMFS  History: Patient Active Problem List   Diagnosis Date Noted   Vulvar itching 10/05/2021   Vaginal discharge 07/24/2021   Vaginal odor 07/24/2021   BV (bacterial vaginosis) 07/24/2021   High risk medication use 10/20/2020   Seropositive rheumatoid arthritis (HCC) 10/07/2020   Positive ANA (antinuclear antibody) 10/07/2020   Effusion, left knee 10/07/2020   Bilateral hand pain 10/07/2020   IBS (irritable bowel syndrome) 05/23/2020   Hematemesis 05/23/2020   OSA (obstructive sleep apnea) 11/12/2019   Essential hypertension 08/28/2019   Hyperlipidemia 08/28/2019   DOE (dyspnea on exertion) 08/28/2019   Atypical chest pain 08/28/2019   History of parotidectomy 10/27/2018   Rectal bleeding 08/21/2017   History of colon polyps 05/09/2017   Latent tuberculosis by skin test 01/03/2017   Chronic cough 01/03/2017   Asthma-COPD overlap syndrome (HCC) 01/03/2017   Past Medical History:  Diagnosis Date   Anxiety    Arthritis    hands and knees   COPD (chronic obstructive pulmonary disease) (HCC)    Depression    GERD (gastroesophageal reflux disease)    Hypertension    Mass of right parotid gland     Family History  Problem Relation Age of Onset   Healthy Mother    Healthy Father    Depression Sister    Hypertension Sister    Sleep apnea Sister    Diabetes Sister    Hypertension Sister    Healthy Brother    Throat cancer Brother    Healthy Brother    Autism Brother    Healthy Daughter    Healthy Daughter    Healthy Son     Past Surgical History:  Procedure Laterality Date   ABDOMINAL HYSTERECTOMY     BIOPSY  06/01/2020   Procedure: BIOPSY;  Surgeon: Eartha Angelia Sieving, MD;  Location: [facility];  Service: Gastroenterology;;   BREAST SURGERY Bilateral    breast bx   CERVICAL SPINE SURGERY     ESOPHAGOGASTRODUODENOSCOPY (EGD) WITH PROPOFOL  N/A 06/01/2020   Procedure: ESOPHAGOGASTRODUODENOSCOPY (EGD) WITH PROPOFOL ;  Surgeon: Eartha Angelia Sieving, MD;   Location: [facility];  Service: Gastroenterology;  Laterality: N/A;  AM   HERNIA REPAIR     UHR   PAROTIDECTOMY Right 10/27/2018   Procedure: RIGHT PAROTIDECTOMY;  Surgeon: Karis Clunes, MD;  Location: [facility];  Service: ENT;  Laterality: Right;   POLYPECTOMY  06/01/2020   Procedure: POLYPECTOMY;  Surgeon: Eartha Angelia Sieving, MD;  Location: [facility];  Service: Gastroenterology;;  duodenal nodule;    Social History   Occupational History   Not on file  Tobacco Use   Smoking status: Former    Current packs/day: 0.00    Average packs/day: 1 pack/day for 15.0 years (15.0 ttl pk-yrs)    Types: Cigarettes    Start date: 03/19/1973    Quit date: 03/19/1988    Years since quitting: 36.0   Smokeless tobacco: Never  Vaping Use   Vaping status: Never  Used  Substance and Sexual Activity   Alcohol use: No   Drug use: No   Sexual activity: Yes    Birth control/protection: Surgical    Comment: hyst        "

## 2024-04-05 ENCOUNTER — Encounter: Payer: Self-pay | Admitting: Surgical

## 2024-04-15 ENCOUNTER — Ambulatory Visit: Admitting: Physical Therapy

## 2024-04-15 NOTE — Therapy (Incomplete)
 " OUTPATIENT PHYSICAL THERAPY EVALUATION   Patient Name: Cynthia Brewer MRN: [id number] DOB:25-Aug-1950, 74 y.o., female Today's Date: 04/15/2024  END OF SESSION:   Past Medical History:  Diagnosis Date   Anxiety    Arthritis    hands and knees   COPD (chronic obstructive pulmonary disease) (HCC)    Depression    GERD (gastroesophageal reflux disease)    Hypertension    Mass of right parotid gland    Past Surgical History:  Procedure Laterality Date   ABDOMINAL HYSTERECTOMY     BIOPSY  06/01/2020   Procedure: BIOPSY;  Surgeon: Eartha Angelia Sieving, MD;  [facility]: [facility];  Service: Gastroenterology;;   BREAST SURGERY Bilateral    breast bx   CERVICAL SPINE SURGERY     ESOPHAGOGASTRODUODENOSCOPY (EGD) WITH PROPOFOL  N/A 06/01/2020   Procedure: ESOPHAGOGASTRODUODENOSCOPY (EGD) WITH PROPOFOL ;  Surgeon: Eartha Angelia Sieving, MD;  [facility];  Service: Gastroenterology;  Laterality: N/A;  AM   HERNIA REPAIR     UHR   PAROTIDECTOMY Right 10/27/2018   Procedure: RIGHT PAROTIDECTOMY;  Surgeon: Karis Clunes, MD;  Location: [facility];  Service: ENT;  Laterality: Right;   POLYPECTOMY  06/01/2020   Procedure: POLYPECTOMY;  Surgeon: Eartha Angelia Sieving, MD;  [facility];  Service: Gastroenterology;;  duodenal nodule;    Patient Active Problem List   Diagnosis Date Noted   Vulvar itching 10/05/2021   Vaginal discharge 07/24/2021   Vaginal odor 07/24/2021   BV (bacterial vaginosis) 07/24/2021   High risk medication use 10/20/2020   Seropositive rheumatoid arthritis (HCC) 10/07/2020   Positive ANA (antinuclear antibody) 10/07/2020   Effusion, left knee 10/07/2020   Bilateral hand pain 10/07/2020   IBS (irritable bowel syndrome) 05/23/2020   Hematemesis 05/23/2020   OSA (obstructive sleep apnea) 11/12/2019   Essential hypertension 08/28/2019   Hyperlipidemia 08/28/2019   DOE (dyspnea on exertion) 08/28/2019   Atypical chest  pain 08/28/2019   History of parotidectomy 10/27/2018   Rectal bleeding 08/21/2017   History of colon polyps 05/09/2017   Latent tuberculosis by skin test 01/03/2017   Chronic cough 01/03/2017   Asthma-COPD overlap syndrome (HCC) 01/03/2017    PCP: Dow Longs, PA-C   REFERRING PROVIDER: Shirly Carlin LITTIE, PA-C   REFERRING DIAG: ***  Rationale for Evaluation and Treatment:  Rehabiliation  THERAPY DIAG:  No diagnosis found.  ONSET DATE: ***   SUBJECTIVE:  SUBJECTIVE STATEMENT: reporting right elbow pain. Patient states that she has noticed increased swelling and pain in the medial aspect of her right elbow.   NEXT MD VISIT: ***  PERTINENT HISTORY:  See above PMH  PAIN: *** NPRS scale: /10 upon arrival Pain location: Pain description: constant, achy, sharp Aggravating factors:  Relieving factors: rest, meds   PRECAUTIONS: ,  {Therapy precautions:24002}  RED FLAGS: {PT Red Flags:29287}   WEIGHT BEARING RESTRICTIONS:  {Yes ***/No:24003}  FALLS:  Has patient fallen in last 6 months? {fallsyesno:27318}   OCCUPATION:  ***  PLOF:  {PLOF:24004}  PATIENT GOALS:  ***  OBJECTIVE:  Note: Objective measures were completed at Evaluation unless otherwise noted.  DIAGNOSTIC FINDINGS:  R elbow XR 04/01/24 SOFT TISSUES: Unremarkable.   IMPRESSION: 1. Enthesopathic spurring of both humeral epicondyles. 2. No evidence of fracture, dislocation, or joint effusion.  PATIENT SURVEYS:  Patient-Specific Activity Scoring Scheme  0 represents unable to perform. 10 represents able to perform at prior level. 0 1 2 3 4 5 6 7 8 9  10 (Date and Score)   Activity Eval     1. ***      2. ***      3. ***    4.    5.    Score ***    Total score = sum of the activity  scores/number of activities Minimum detectable change (90%CI) for average score = 2 points Minimum detectable change (90%CI) for single activity score = 3 points     EDEMA:  {Yes/No:304960894}  POSTURE:  {posture:25561}  GAIT: Assistive device utilized: {Assistive devices:23999} Level of assistance: {Levels of assistance:24026} Comments: ***    {PT ROM TABLES:29304}  SPECIAL TESTS:  {PT Special Tests:29286}  FUNCTIONAL TESTS:  {Functional tests:24029}                                                                                                                              TREATMENT DATE:  Eval HEP creation and review with demonstration and trial set preformed, see below for details Selfcare:see education section below    PATIENT EDUCATION: Education details: HEP, PT plan of care, selfcare Person educated: Patient Education method: Explanation, Demonstration, Verbal cues, and Handouts Education comprehension: verbalized understanding, further education recommended   HOME EXERCISE PROGRAM: ***  ASSESSMENT:  CLINICAL IMPRESSION: Patient referred to PT for Eval and treat right elbow Medial epicondylitis. 1-2X per week x 6 weeks. Patient will benefit from skilled PT to improve overall function and to address impairments and limitations listed below.  OBJECTIVE IMPAIRMENTS: decreased activity tolerance for ADL's, difficulty walking, decreased balance, decreased endurance, decreased mobility, decreased ROM, decreased strength, impaired flexibility, impaired UE/LE use, and pain.  ACTIVITY LIMITATIONS: bending, liftting, walking, standing, cleaning, community activity, driving, reaching, carry, occupation  PERSONAL FACTORS: see above PMH  also affecting patient's functional outcome.  REHAB POTENTIAL: {rehabpotential:25112}  CLINICAL DECISION MAKING: {clinical decision making:25114}  EVALUATION COMPLEXITY: {Evaluation complexity:25115}    GOALS: Short  term  PT Goals Target date: *** Pt will be I and compliant with HEP. Baseline:  Goal status: New Pt will decrease pain by 25% overall Baseline: Goal status: New  Long term PT goals Target date:*** Pt will improve ROM to [facility] to improve functional mobility Baseline: Goal status: New Pt will improve  strength to at least 4+/5 MMT to improve functional strength Baseline: Goal status: New Pt will improve Patient specific functional scale (PSFS) to at least /10 to show improved function level Baseline: Goal status: New Pt will reduce pain to overall less than 3/10 with usual activity and work activity. Baseline: Goal status: New Pt will improve 5 times sit to stand or TUG time to less than 13 seconds to show improved strength, balance.  Baseline: Goal status: New  PLAN: PT FREQUENCY: 1-3 times per week   PT DURATION: 6-12 weeks  PLANNED INTERVENTIONS  {rehab planned interventions:25118::97110-Therapeutic exercises,97530- Therapeutic [id number]- Neuromuscular re-education,97535- Self Rjmz,02859- Manual therapy,Patient/Family education}  PLAN FOR NEXT SESSION: ***   Redell JONELLE Moose, PT, DPT 04/15/2024, 11:27 AM  "

## 2024-04-22 NOTE — Progress Notes (Unsigned)
 "  Office Visit Note  Patient: Cynthia Brewer             Date of Birth: [birth year]           MRN: [id number]             PCP: Dow Longs, PA-C Referring: Dow Longs, PA-C Visit Date: 04/30/2024   Subjective:  No chief complaint on file.   History of Present Illness: Cynthia Brewer is a 74 y.o. female here for follow up for seropositive RA on methotrexate  15 mg PO weekly and folic acid  1 mg daily.   Previous HPI 03/24/2021 Cynthia Brewer is a 74 y.o. female here for follow up for seropositive RA on methotrexate  15 mg PO weekly and folic acid  1 mg daily. Since our last visit she discontinued amlodipine  with improvement in her leg edema and associated pain. Her arthritis is doing well overall and no recurrence of left knee swelling. She is concerned about worsening hair loss and wants to discontinue methotrexate  because of this problem.   Previous HPI 12/07/20 Cynthia Brewer is a 74 y.o. female here for follow up for seropositive RA after starting methotrexate  15 mg PO weekly. Her start was slightly delayed due to probably viral URI symptoms. She was also referred for lymphedema therapy at recommendation of nursing evaluation.  Left knee pain continues to be the biggest problem for her.  Bilateral hand swelling and stiffness also ongoing for 30 minutes or less per day.  She has not yet seen [facility] for starting further lymphedema treatment sounds like discussed some type of lower extremity compression.   10/07/20 Cynthia Brewer is a 74 y.o. female here for inflammation of the left knee with positive ANA and rheumatoid factor. She reports knee pain and swelling started less than 1 year ago but does not recall an exact onset time. She does not recall any preceding events or medication changes associated with this.  For this she had some joint pain and stiffness in her bilateral hands that has been ongoing for about 3 years.  She has not typically noticed swelling in her finger  joints describes an occasional appearance of dark or red discoloration MCP joints and distally.  Knee aspiration with steroid injection was performed last month with 15 cc of fluid of fairly bland fluid no significant neutrophil elevation or crystals present.  She noticed a symptom benefit but relatively rapid return of symptoms.   No Rheumatology ROS completed.   PMFS History:  Patient Active Problem List   Diagnosis Date Noted   Vulvar itching 10/05/2021   Vaginal discharge 07/24/2021   Vaginal odor 07/24/2021   BV (bacterial vaginosis) 07/24/2021   High risk medication use 10/20/2020   Seropositive rheumatoid arthritis (HCC) 10/07/2020   Positive ANA (antinuclear antibody) 10/07/2020   Effusion, left knee 10/07/2020   Bilateral hand pain 10/07/2020   IBS (irritable bowel syndrome) 05/23/2020   Hematemesis 05/23/2020   OSA (obstructive sleep apnea) 11/12/2019   Essential hypertension 08/28/2019   Hyperlipidemia 08/28/2019   DOE (dyspnea on exertion) 08/28/2019   Atypical chest pain 08/28/2019   History of parotidectomy 10/27/2018   Rectal bleeding 08/21/2017   History of colon polyps 05/09/2017   Latent tuberculosis by skin test 01/03/2017   Chronic cough 01/03/2017   Asthma-COPD overlap syndrome (HCC) 01/03/2017    Past Medical History:  Diagnosis Date   Anxiety    Arthritis    hands and knees   COPD (chronic  obstructive pulmonary disease) (HCC)    Depression    GERD (gastroesophageal reflux disease)    Hypertension    Mass of right parotid gland     Family History  Problem Relation Age of Onset   Healthy Mother    Healthy Father    Depression Sister    Hypertension Sister    Sleep apnea Sister    Diabetes Sister    Hypertension Sister    Healthy Brother    Throat cancer Brother    Healthy Brother    Autism Brother    Healthy Daughter    Healthy Daughter    Healthy Son    Past Surgical History:  Procedure Laterality Date   ABDOMINAL HYSTERECTOMY      BIOPSY  06/01/2020   Procedure: BIOPSY;  Surgeon: Eartha Angelia Sieving, MD;  Location: [facility];  Service: Gastroenterology;;   BREAST SURGERY Bilateral    breast bx   CERVICAL SPINE SURGERY     ESOPHAGOGASTRODUODENOSCOPY (EGD) WITH PROPOFOL  N/A 06/01/2020   Procedure: ESOPHAGOGASTRODUODENOSCOPY (EGD) WITH PROPOFOL ;  Surgeon: Eartha Angelia Sieving, MD;  [facility]: [facility];  Service: Gastroenterology;  Laterality: N/A;  AM   HERNIA REPAIR     UHR   PAROTIDECTOMY Right 10/27/2018   Procedure: RIGHT PAROTIDECTOMY;  Surgeon: Karis Clunes, MD;  Location: [facility];  Service: ENT;  Laterality: Right;   POLYPECTOMY  06/01/2020   Procedure: POLYPECTOMY;  Surgeon: Eartha Angelia Sieving, MD;  [facility]: [facility];  Service: Gastroenterology;;  duodenal nodule;    Social History   Social History Narrative   Not on file   Immunization History  Administered Date(s) Administered   INFLUENZA, HIGH DOSE SEASONAL PF 01/11/2018   Moderna Sars-Covid-2 Vaccination 05/15/2019, 06/12/2019, 01/25/2020, 07/29/2020   Pneumococcal Polysaccharide-23 01/12/2017     Objective: Vital Signs: There were no vitals taken for this visit.   Physical Exam   Musculoskeletal Exam: ***   Investigation: No additional findings.  Imaging: DG ELBOW COMPLETE RIGHT (3+VIEW) Result Date: 04/07/2024 EXAM: 3 VIEW(S) XRAY OF THE RIGHT ELBOW COMPARISON: None available. CLINICAL HISTORY: pain. The patient reports experiencing pain. FINDINGS: BONES AND JOINTS: There is enthesopathic spurring of both humeral epicondyles. No acute fracture. No malalignment. SOFT TISSUES: Unremarkable. IMPRESSION: 1. Enthesopathic spurring of both humeral epicondyles. 2. No evidence of fracture, dislocation, or joint effusion. Electronically signed by: Francis Quam MD 04/07/2024 12:10 AM EST RP Workstation: [id number]    Recent Labs: Lab Results  Component Value Date   WBC 5.3 03/24/2021   HGB 14.0  03/24/2021   PLT 233 03/24/2021   NA 140 03/24/2021   K 4.6 03/24/2021   CL 105 03/24/2021   CO2 31 03/24/2021   GLUCOSE 94 03/24/2021   BUN 17 03/24/2021   CREATININE 1.25 (H) 03/24/2021   BILITOT 0.6 03/24/2021   AST 13 03/24/2021   ALT 14 03/24/2021   PROT 6.7 03/24/2021   CALCIUM 8.9 03/24/2021   GFRAA 57 (L) 10/23/2018   QFTBGOLDPLUS NEGATIVE 10/20/2020    Speciality Comments: No specialty comments available.  Procedures:  No procedures performed Allergies: Penicillins   Assessment / Plan:     Visit Diagnoses:  Assessment & Plan Seropositive rheumatoid arthritis (HCC)     High risk medication use      ***  Follow-Up Instructions: No follow-ups on file.   Hana Trippett M Linet Brash, CMA  Note - This record has been created using Animal nutritionist.  Chart creation errors have been sought, but may not  always  have been located. Such creation errors do not reflect on  the standard of medical care. "

## 2024-04-22 NOTE — Assessment & Plan Note (Signed)
 Cynthia Brewer

## 2024-04-22 NOTE — Assessment & Plan Note (Signed)
 SABRA

## 2024-04-30 ENCOUNTER — Ambulatory Visit: Admitting: Internal Medicine

## 2024-04-30 DIAGNOSIS — Z79899 Other long term (current) drug therapy: Secondary | ICD-10-CM

## 2024-04-30 DIAGNOSIS — M059 Rheumatoid arthritis with rheumatoid factor, unspecified: Secondary | ICD-10-CM

## 2024-06-10 ENCOUNTER — Ambulatory Visit: Admitting: Internal Medicine
